# Patient Record
Sex: Male | Born: 1960 | Hispanic: Yes | Marital: Married | Smoking: Never smoker
Health system: Southern US, Community
[De-identification: ages and names within clinical notes are randomized; demographics above are authoritative.]

## PROBLEM LIST (undated history)

## (undated) DIAGNOSIS — F32A Depression, unspecified: Secondary | ICD-10-CM

## (undated) DIAGNOSIS — I10 Essential (primary) hypertension: Secondary | ICD-10-CM

## (undated) DIAGNOSIS — F329 Major depressive disorder, single episode, unspecified: Secondary | ICD-10-CM

## (undated) DIAGNOSIS — Z86010 Personal history of colonic polyps: Secondary | ICD-10-CM

---
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
## 2016-01-11 ENCOUNTER — Emergency Department
Admission: EM | Admit: 2016-01-11 | Discharge: 2016-01-11 | Disposition: A | Discharge status: Home / Self Care | Payer: Medicare Other | Attending: Emergency Medicine | Admitting: Emergency Medicine

## 2016-01-11 ENCOUNTER — Emergency Department: Payer: Medicare Other

## 2016-01-11 DIAGNOSIS — N201 Calculus of ureter: Secondary | ICD-10-CM | POA: Diagnosis not present

## 2016-01-11 DIAGNOSIS — R109 Unspecified abdominal pain: Secondary | ICD-10-CM

## 2016-01-11 LAB — URINALYSIS COMPLETE WITH MICROSCOPIC (ARMC ONLY)
BACTERIA UA: NONE SEEN
Bilirubin Urine: NEGATIVE
Glucose, UA: NEGATIVE mg/dL
KETONES UR: NEGATIVE mg/dL
Leukocytes, UA: NEGATIVE
NITRITE: NEGATIVE
PH: 5 (ref 5.0–8.0)
PROTEIN: NEGATIVE mg/dL
SPECIFIC GRAVITY, URINE: 1.018 (ref 1.005–1.030)

## 2016-01-11 MED ORDER — ONDANSETRON HCL 4 MG PO TABS: 4.0000 mg | ORAL_TABLET | Freq: Every day | ORAL | 1 refills | Status: DC | PRN

## 2016-01-11 MED ORDER — SODIUM CHLORIDE 0.9 % IV SOLN
1000.0000 mL | Freq: Once | INTRAVENOUS | Status: AC
  Administered 2016-01-11: 1000 mL via INTRAVENOUS

## 2016-01-11 MED ORDER — TAMSULOSIN HCL 0.4 MG PO CAPS: 0.4000 mg | ORAL_CAPSULE | Freq: Every day | ORAL | 0 refills | Status: DC

## 2016-01-11 MED ORDER — KETOROLAC TROMETHAMINE 30 MG/ML IJ SOLN
30.0000 mg | Freq: Once | INTRAMUSCULAR | Status: AC
  Administered 2016-01-11: 30 mg via INTRAVENOUS

## 2016-01-11 MED ORDER — ONDANSETRON HCL 4 MG/2ML IJ SOLN
4.0000 mg | Freq: Once | INTRAMUSCULAR | Status: AC
  Administered 2016-01-11: 4 mg via INTRAVENOUS

## 2016-01-11 MED ORDER — HYDROCODONE-ACETAMINOPHEN 5-325 MG PO TABS: 1.0000 | ORAL_TABLET | ORAL | 0 refills | Status: DC | PRN

## 2016-01-11 MED ORDER — ONDANSETRON HCL 4 MG/2ML IJ SOLN
INTRAMUSCULAR | Status: AC
  Administered 2016-01-11: 4 mg via INTRAVENOUS
  Filled 2016-01-11: qty 2

## 2016-01-11 MED ORDER — NAPROXEN 500 MG PO TABS: 500.0000 mg | ORAL_TABLET | Freq: Two times a day (BID) | ORAL | 2 refills | Status: AC

## 2016-01-11 MED ORDER — KETOROLAC TROMETHAMINE 30 MG/ML IJ SOLN
INTRAMUSCULAR | Status: AC
  Administered 2016-01-11: 30 mg via INTRAVENOUS
  Filled 2016-01-11: qty 1

## 2016-01-11 NOTE — ED Notes (Signed)
Pt unable to provide urine specimen during triage. Pt given specimen cup for later attempt.

## 2016-01-11 NOTE — ED Triage Notes (Signed)
Pt here for right sided flank pain that started this morning. Does report hx of kidney stones.

## 2016-01-11 NOTE — ED Provider Notes (Signed)
[facility] Emergency Department Provider Note   ____________________________________________    I have reviewed the triage vital signs and the nursing notes.   HISTORY  Chief Complaint Flank Pain    HPI Alfred Clements is a 55 y.o. male who presents with complaints of sharp right-sided flank pain which started abruptly this morning. The pain is in his right flank and radiates to his right groin. He reports a history of kidney stones and this feels similar. He reports the pain has become severe enough that he is nauseous. He denies fevers or chills. No dysuria.   No past medical history on file.  There are no active problems to display for this patient.   No past surgical history on file.  Prior to Admission medications   Medication Sig Start Date End Date Taking? Authorizing Provider  HYDROcodone-acetaminophen (NORCO/VICODIN) 5-325 MG tablet Take 1 tablet by mouth every 4 (four) hours as needed for moderate pain. 01/11/16   Lavonia Drafts, MD  naproxen (NAPROSYN) 500 MG tablet Take 1 tablet (500 mg total) by mouth 2 (two) times daily with a meal. 01/11/16   Lavonia Drafts, MD  ondansetron (ZOFRAN) 4 MG tablet Take 1 tablet (4 mg total) by mouth daily as needed for nausea or vomiting. 01/11/16   Lavonia Drafts, MD  tamsulosin (FLOMAX) 0.4 MG CAPS capsule Take 1 capsule (0.4 mg total) by mouth daily. 01/11/16   Lavonia Drafts, MD     Allergies Penicillins  No family history on file.  Social History Social History  Substance Use Topics  . Smoking status: Not on file  . Smokeless tobacco: Not on file  . Alcohol use Not on file  Denies alcohol or drug use, nonsmoker  Review of Systems  Constitutional: No fever/chills   Cardiovascular: Denies chest pain. Respiratory: Denies shortness of breath. Gastrointestinal: As above Genitourinary: Negative for dysuria. Positive hematuria  Skin: Negative for rash. Neurological: Negative for headaches  or weakness  10-point ROS otherwise negative.  ____________________________________________   PHYSICAL EXAM:  VITAL SIGNS: ED Triage Vitals  Enc Vitals Group     BP 01/11/16 1210 (!) 166/104     Pulse Rate 01/11/16 1210 60     Resp 01/11/16 1210 18     Temp 01/11/16 1210 98 F (36.7 C)     Temp Source 01/11/16 1210 Oral     SpO2 01/11/16 1210 99 %     Weight 01/11/16 1210 219 lb (99.3 kg)     Height 01/11/16 1210 5\' 9"  (1.753 m)     Head Circumference --      Peak Flow --      Pain Score 01/11/16 1211 9     Pain Loc --      Pain Edu? --      Excl. in [place]? --     Constitutional: Alert and oriented.Obviously in significant pain Eyes: Conjunctivae are normal.   Nose: No congestion/rhinnorhea. Mouth/Throat: Mucous membranes are moist.    Cardiovascular: Normal rate, regular rhythm. Grossly normal heart sounds.  Good peripheral circulation. Respiratory: Normal respiratory effort.  No retractions. Lungs CTAB. Gastrointestinal: Soft and nontender. No distention.  No CVA tenderness. Genitourinary: deferred Musculoskeletal: No lower extremity tenderness nor edema.  Warm and well perfused Neurologic:  Normal speech and language. No gross focal neurologic deficits are appreciated.  Skin:  Skin is warm, dry and intact. No rash noted. Psychiatric: Mood and affect are normal. Speech and behavior are normal.  ____________________________________________   LABS (all  labs ordered are listed, but only abnormal results are displayed)  Labs Reviewed  URINALYSIS COMPLETEWITH MICROSCOPIC ([place]) - Abnormal; Notable for the following:       Result Value   Color, Urine YELLOW (*)    APPearance CLEAR (*)    Hgb urine dipstick 2+ (*)    Squamous Epithelial / LPF 0-5 (*)    All other components within normal limits   ____________________________________________  EKG  None ____________________________________________  RADIOLOGY  CT demonstrates 3 mm UVJ  stone ____________________________________________   PROCEDURES  Procedure(s) performed: No    Critical Care performed: No ____________________________________________   INITIAL IMPRESSION / ASSESSMENT AND PLAN / ED COURSE  Pertinent labs & imaging results that were available during my care of the patient were reviewed by me and considered in my medical decision making (see chart for details).  Patient presents with complaints of right-sided flank pain, most consistent with ureterolithiasis. We'll give IV Toradol, IV fluids, obtain CT renal stone study and reevaluate  Clinical Course  Patient and complete resolution of pain after IV Toradol. His CT scan does demonstrate 3 mm stone at the UVJ, this will likely pass. We will provide analgesics and outpatient follow up as needed. Return precautions discussed. ____________________________________________   FINAL CLINICAL IMPRESSION(S) / ED DIAGNOSES  Final diagnoses:  Right flank pain  Ureterolithiasis      NEW MEDICATIONS STARTED DURING THIS VISIT:  New Prescriptions   HYDROCODONE-ACETAMINOPHEN (NORCO/VICODIN) 5-325 MG TABLET    Take 1 tablet by mouth every 4 (four) hours as needed for moderate pain.   NAPROXEN (NAPROSYN) 500 MG TABLET    Take 1 tablet (500 mg total) by mouth 2 (two) times daily with a meal.   ONDANSETRON (ZOFRAN) 4 MG TABLET    Take 1 tablet (4 mg total) by mouth daily as needed for nausea or vomiting.   TAMSULOSIN (FLOMAX) 0.4 MG CAPS CAPSULE    Take 1 capsule (0.4 mg total) by mouth daily.     Note:  This document was prepared using Dragon voice recognition software and may include unintentional dictation errors.    Lavonia Drafts, MD 01/11/16 [id number]

## 2019-07-28 ENCOUNTER — Other Ambulatory Visit: Payer: Self-pay

## 2019-07-28 ENCOUNTER — Other Ambulatory Visit
Admission: RE | Admit: 2019-07-28 | Discharge: 2019-07-28 | Disposition: A | Discharge status: Home / Self Care | Payer: Medicare Other | Source: Ambulatory Visit | Attending: General Surgery | Admitting: General Surgery

## 2019-07-28 DIAGNOSIS — Z01812 Encounter for preprocedural laboratory examination: Secondary | ICD-10-CM | POA: Diagnosis present

## 2019-07-28 DIAGNOSIS — Z20822 Contact with and (suspected) exposure to covid-19: Secondary | ICD-10-CM | POA: Insufficient documentation

## 2019-07-29 ENCOUNTER — Encounter: Payer: Self-pay | Admitting: General Surgery

## 2019-07-29 LAB — SARS CORONAVIRUS 2 (TAT 6-24 HRS): SARS Coronavirus 2: NEGATIVE

## 2019-07-30 ENCOUNTER — Ambulatory Visit: Payer: Medicare Other | Admitting: Certified Registered"

## 2019-07-30 ENCOUNTER — Encounter
Admission: RE | Disposition: A | Discharge status: Home / Self Care | Payer: Self-pay | Source: Ambulatory Visit | Attending: General Surgery

## 2019-07-30 ENCOUNTER — Other Ambulatory Visit: Payer: Self-pay

## 2019-07-30 ENCOUNTER — Ambulatory Visit
Admission: RE | Admit: 2019-07-30 | Discharge: 2019-07-30 | Disposition: A | Discharge status: Home / Self Care | Payer: Medicare Other | Source: Ambulatory Visit | Attending: General Surgery | Admitting: General Surgery

## 2019-07-30 ENCOUNTER — Encounter: Payer: Self-pay | Admitting: General Surgery

## 2019-07-30 DIAGNOSIS — I10 Essential (primary) hypertension: Secondary | ICD-10-CM | POA: Insufficient documentation

## 2019-07-30 DIAGNOSIS — Z79899 Other long term (current) drug therapy: Secondary | ICD-10-CM | POA: Diagnosis not present

## 2019-07-30 DIAGNOSIS — Z1211 Encounter for screening for malignant neoplasm of colon: Secondary | ICD-10-CM | POA: Insufficient documentation

## 2019-07-30 DIAGNOSIS — D123 Benign neoplasm of transverse colon: Secondary | ICD-10-CM | POA: Insufficient documentation

## 2019-07-30 DIAGNOSIS — Z880 Allergy status to penicillin: Secondary | ICD-10-CM | POA: Insufficient documentation

## 2019-07-30 DIAGNOSIS — Z86010 Personal history of colonic polyps: Secondary | ICD-10-CM | POA: Diagnosis not present

## 2019-07-30 DIAGNOSIS — K5730 Diverticulosis of large intestine without perforation or abscess without bleeding: Secondary | ICD-10-CM | POA: Insufficient documentation

## 2019-07-30 DIAGNOSIS — Z791 Long term (current) use of non-steroidal anti-inflammatories (NSAID): Secondary | ICD-10-CM | POA: Diagnosis not present

## 2019-07-30 DIAGNOSIS — F329 Major depressive disorder, single episode, unspecified: Secondary | ICD-10-CM | POA: Diagnosis not present

## 2019-07-30 HISTORY — DX: Essential (primary) hypertension: I10

## 2019-07-30 HISTORY — PX: COLONOSCOPY WITH PROPOFOL: SHX5780

## 2019-07-30 HISTORY — DX: Depression, unspecified: F32.A

## 2019-07-30 HISTORY — DX: Personal history of colonic polyps: Z86.010

## 2019-07-30 SURGERY — COLONOSCOPY WITH PROPOFOL
Anesthesia: General

## 2019-07-30 MED ORDER — PROPOFOL 500 MG/50ML IV EMUL
INTRAVENOUS | Status: AC
  Filled 2019-07-30: qty 50

## 2019-07-30 MED ORDER — PROPOFOL 500 MG/50ML IV EMUL
INTRAVENOUS | Status: DC | PRN
  Administered 2019-07-30: 150 ug/kg/min via INTRAVENOUS

## 2019-07-30 MED ORDER — PROPOFOL 10 MG/ML IV BOLUS
INTRAVENOUS | Status: DC | PRN
  Administered 2019-07-30: 50 mg via INTRAVENOUS

## 2019-07-30 MED ORDER — ONDANSETRON HCL 4 MG/2ML IJ SOLN
INTRAMUSCULAR | Status: AC
  Filled 2019-07-30: qty 2

## 2019-07-30 MED ORDER — SODIUM CHLORIDE 0.9 % IV SOLN: INTRAVENOUS | Status: DC

## 2019-07-30 MED ORDER — ONDANSETRON HCL 4 MG/2ML IJ SOLN
4.0000 mg | Freq: Once | INTRAMUSCULAR | Status: AC
  Administered 2019-07-30: 4 mg via INTRAVENOUS

## 2019-07-30 NOTE — Anesthesia Preprocedure Evaluation (Signed)
Anesthesia Evaluation  Patient identified by MRN, date of birth, ID band Patient awake    Reviewed: Allergy & Precautions, H&P , NPO status , Patient's Chart, lab work & pertinent test results  Airway Mallampati: II  TM Distance: >3 FB Neck ROM: full    Dental  (+) Teeth Intact   Pulmonary neg pulmonary ROS, neg sleep apnea, neg COPD, Not current smoker,    Pulmonary exam normal        Cardiovascular hypertension, (-) angina(-) Past MI Normal cardiovascular exam(-) dysrhythmias      Neuro/Psych negative neurological ROS  negative psych ROS   GI/Hepatic negative GI ROS, Neg liver ROS,   Endo/Other  negative endocrine ROS  Renal/GU negative Renal ROS  negative genitourinary   Musculoskeletal   Abdominal   Peds  Hematology negative hematology ROS (+)   Anesthesia Other Findings Past Medical History: No date: Depression No date: Hx of colonic polyps No date: Hypertension  History reviewed. No pertinent surgical history.  BMI    Body Mass Index: 30.42 kg/m      Reproductive/Obstetrics negative OB ROS                             Anesthesia Physical Anesthesia Plan  ASA: II  Anesthesia Plan: General   Post-op Pain Management:    Induction:   PONV Risk Score and Plan: Propofol infusion and TIVA  Airway Management Planned: Natural Airway and Nasal Cannula  Additional Equipment:   Intra-op Plan:   Post-operative Plan:   Informed Consent: I have reviewed the patients History and Physical, chart, labs and discussed the procedure including the risks, benefits and alternatives for the proposed anesthesia with the patient or authorized representative who has indicated his/her understanding and acceptance.     Dental Advisory Given  Plan Discussed with: Anesthesiologist, CRNA and Surgeon  Anesthesia Plan Comments:         Anesthesia Quick Evaluation

## 2019-07-30 NOTE — OR Nursing (Signed)
Pt vomiting up mucus. On investigation he was nauseated. zofran adm.at 1122am.

## 2019-07-30 NOTE — Transfer of Care (Signed)
Immediate Anesthesia Transfer of Care Note  Patient: Alfred Clements  Procedure(s) Performed: COLONOSCOPY WITH PROPOFOL (N/A )  Patient Location: PACU  Anesthesia Type:General  Level of Consciousness: awake, alert  and oriented  Airway & Oxygen Therapy: Patient Spontanous Breathing and Patient connected to nasal cannula oxygen  Post-op Assessment: Report given to RN and Post -op Vital signs reviewed and stable  Post vital signs: Reviewed and stable  Last Vitals:  Vitals Value Taken Time  BP    Temp    Pulse    Resp    SpO2      Last Pain:  Vitals:   07/30/19 0944  TempSrc: Temporal         Complications: No apparent anesthesia complications

## 2019-07-30 NOTE — H&P (Signed)
Alfred Clements [id number] [birth year]     HPI:  59 year old Spanish speaking male reports colonoscopy in [place] about 7years ago. Three polyps identified. Original recommendation was for a f/u exam in five years.  Tolerated prep without difficulty.     Medications Prior to Admission  Medication Sig Dispense Refill Last Dose  . FLUoxetine (PROZAC) 20 MG tablet Take 20 mg by mouth daily.     Marland Kitchen gabapentin (NEURONTIN) 300 MG capsule Take 300 mg by mouth at bedtime.     . hydrochlorothiazide (HYDRODIURIL) 12.5 MG tablet Take 12.5 mg by mouth daily.     . Multiple Vitamin (MULTIVITAMIN) tablet Take 1 tablet by mouth daily.     Marland Kitchen HYDROcodone-acetaminophen (NORCO/VICODIN) 5-325 MG tablet Take 1 tablet by mouth every 4 (four) hours as needed for moderate pain. 20 tablet 0   . naproxen (NAPROSYN) 500 MG tablet Take 1 tablet (500 mg total) by mouth 2 (two) times daily with a meal. 20 tablet 2   . ondansetron (ZOFRAN) 4 MG tablet Take 1 tablet (4 mg total) by mouth daily as needed for nausea or vomiting. 20 tablet 1   . tamsulosin (FLOMAX) 0.4 MG CAPS capsule Take 1 capsule (0.4 mg total) by mouth daily. 7 capsule 0    Allergies  Allergen Reactions  . Penicillins Hives   Past Medical History:  Diagnosis Date  . Depression   . Hx of colonic polyps   . Hypertension    History reviewed. No pertinent surgical history. Social History   Socioeconomic History  . Marital status: Married    Spouse name: Not on file  . Number of children: Not on file  . Years of education: Not on file  . Highest education level: Not on file  Occupational History  . Not on file  Tobacco Use  . Smoking status: Never Smoker  . Smokeless tobacco: Never Used  Substance and Sexual Activity  . Alcohol use: Never  . Drug use: Never  . Sexual activity: Not on file  Other Topics Concern  . Not on file  Social History Narrative  . Not on file   Social Determinants of Health   Financial Resource Strain:   .  Difficulty of Paying Living Expenses:   Food Insecurity:   . Worried About Charity fundraiser in the Last Year:   . Arboriculturist in the Last Year:   Transportation Needs:   . Film/video editor (Medical):   Marland Kitchen Lack of Transportation (Non-Medical):   Physical Activity:   . Days of Exercise per Week:   . Minutes of Exercise per Session:   Stress:   . Feeling of Stress :   Social Connections:   . Frequency of Communication with Friends and Family:   . Frequency of Social Gatherings with Friends and Family:   . Attends Religious Services:   . Active Member of Clubs or Organizations:   . Attends Archivist Meetings:   Marland Kitchen Marital Status:   Intimate Partner Violence:   . Fear of Current or Ex-Partner:   . Emotionally Abused:   Marland Kitchen Physically Abused:   . Sexually Abused:    Social History   Social History Narrative  . Not on file     ROS: Negative.     PE: HEENT: Negative. Lungs: Clear. Cardio: RR.   Assessment/Plan:  Proceed with planned endoscopy.Alfred Clements Alfred Clements 07/30/2019

## 2019-07-30 NOTE — Op Note (Signed)
[facility] Gastroenterology Patient Name: Alfred Clements Procedure Date: 07/30/2019 10:11 AM MRN: [id number] Account #: [id number] Date of Birth: 1960/10/17 Admit Type: Outpatient Age: 59 Room: [facility] ENDO ROOM 1 Gender: Male Note Status: Finalized Procedure:             Colonoscopy Indications:           High risk colon cancer surveillance: Personal history                         of colonic polyps Providers:             Robert Bellow, MD Referring MD:          Andres Labrum, MD (Referring MD) Medicines:             Monitored Anesthesia Care Complications:         No immediate complications. Procedure:             Pre-Anesthesia Assessment:                        - Prior to the procedure, a History and Physical was                         performed, and patient medications, allergies and                         sensitivities were reviewed. The patient's tolerance                         of previous anesthesia was reviewed.                        - The risks and benefits of the procedure and the                         sedation options and risks were discussed with the                         patient. All questions were answered and informed                         consent was obtained.                        After obtaining informed consent, the colonoscope was                         passed under direct vision. Throughout the procedure,                         the patient's blood pressure, pulse, and oxygen                         saturations were monitored continuously. The                         Colonoscope was introduced through the anus and                         advanced to the the cecum,  identified by appendiceal                         orifice and ileocecal valve. The colonoscopy was                         performed without difficulty. The patient tolerated                         the procedure well. The quality of the bowel       preparation was excellent. Findings:      A 5 mm polyp was found in the proximal transverse colon. The polyp was       sessile. Biopsies were taken with a cold forceps for histology.      A few small-mouthed diverticula were found in the sigmoid colon.      The retroflexed view of the distal rectum and anal verge was normal and       showed no anal or rectal abnormalities. Impression:            - One 5 mm polyp in the proximal transverse colon.                         Biopsied.                        - Diverticulosis in the sigmoid colon.                        - The distal rectum and anal verge are normal on                         retroflexion view. Recommendation:        - Telephone endoscopist for pathology results in 1                         week. Procedure Code(s):     --- Professional ---                        [phone], Colonoscopy, flexible; with biopsy, single or                         multiple Diagnosis Code(s):     --- Professional ---                        Z86.010, Personal history of colonic polyps                        K63.5, Polyp of colon                        K57.30, Diverticulosis of large intestine without                         perforation or abscess without bleeding CPT copyright 2019 American Medical Association. All rights reserved. The codes documented in this report are preliminary and upon coder review may  be revised to meet current compliance requirements. Robert Bellow, MD 07/30/2019 10:38:49 AM This report has been signed electronically. Number of Addenda: 0 Note Initiated On: 07/30/2019  10:11 AM Scope Withdrawal Time: 0 hours 8 minutes 50 seconds  Total Procedure Duration: 0 hours 17 minutes 44 seconds  Estimated Blood Loss:  Estimated blood loss: none.      [facility]

## 2019-07-31 ENCOUNTER — Encounter: Payer: Self-pay | Admitting: *Deleted

## 2019-07-31 LAB — SURGICAL PATHOLOGY

## 2019-07-31 NOTE — Anesthesia Postprocedure Evaluation (Signed)
Anesthesia Post Note  Patient: Alfred Clements  Procedure(s) Performed: COLONOSCOPY WITH PROPOFOL (N/A )  Patient location during evaluation: PACU Anesthesia Type: General Level of consciousness: awake and alert Pain management: pain level controlled Vital Signs Assessment: post-procedure vital signs reviewed and stable Respiratory status: spontaneous breathing, nonlabored ventilation and respiratory function stable Cardiovascular status: blood pressure returned to baseline and stable Postop Assessment: no apparent nausea or vomiting Anesthetic complications: no     Last Vitals:  Vitals:   07/30/19 1110 07/30/19 1120  BP: 113/80 118/78  Pulse: (!) 46 (!) 48  Resp: 19 (!) 22  Temp:    SpO2: 99% 99%    Last Pain:  Vitals:   07/30/19 1040  TempSrc: Temporal                 Tera Mater

## 2020-02-04 ENCOUNTER — Other Ambulatory Visit: Payer: Self-pay

## 2020-02-04 ENCOUNTER — Inpatient Hospital Stay
Admission: EM | Admit: 2020-02-04 | Discharge: 2020-03-13 | DRG: 208 | Disposition: E | Payer: Medicare Other | Attending: Internal Medicine | Admitting: Internal Medicine

## 2020-02-04 ENCOUNTER — Emergency Department: Payer: Medicare Other

## 2020-02-04 DIAGNOSIS — K219 Gastro-esophageal reflux disease without esophagitis: Secondary | ICD-10-CM | POA: Diagnosis present

## 2020-02-04 DIAGNOSIS — I10 Essential (primary) hypertension: Secondary | ICD-10-CM | POA: Diagnosis present

## 2020-02-04 DIAGNOSIS — J9600 Acute respiratory failure, unspecified whether with hypoxia or hypercapnia: Secondary | ICD-10-CM | POA: Diagnosis not present

## 2020-02-04 DIAGNOSIS — Z880 Allergy status to penicillin: Secondary | ICD-10-CM | POA: Diagnosis not present

## 2020-02-04 DIAGNOSIS — Y92239 Unspecified place in hospital as the place of occurrence of the external cause: Secondary | ICD-10-CM | POA: Diagnosis not present

## 2020-02-04 DIAGNOSIS — R109 Unspecified abdominal pain: Secondary | ICD-10-CM

## 2020-02-04 DIAGNOSIS — F32A Depression, unspecified: Secondary | ICD-10-CM | POA: Diagnosis present

## 2020-02-04 DIAGNOSIS — E878 Other disorders of electrolyte and fluid balance, not elsewhere classified: Secondary | ICD-10-CM | POA: Diagnosis present

## 2020-02-04 DIAGNOSIS — K921 Melena: Secondary | ICD-10-CM | POA: Diagnosis present

## 2020-02-04 DIAGNOSIS — A4189 Other specified sepsis: Secondary | ICD-10-CM | POA: Diagnosis not present

## 2020-02-04 DIAGNOSIS — I469 Cardiac arrest, cause unspecified: Secondary | ICD-10-CM | POA: Diagnosis not present

## 2020-02-04 DIAGNOSIS — Z833 Family history of diabetes mellitus: Secondary | ICD-10-CM | POA: Diagnosis not present

## 2020-02-04 DIAGNOSIS — R0902 Hypoxemia: Secondary | ICD-10-CM

## 2020-02-04 DIAGNOSIS — R748 Abnormal levels of other serum enzymes: Secondary | ICD-10-CM | POA: Diagnosis not present

## 2020-02-04 DIAGNOSIS — F419 Anxiety disorder, unspecified: Secondary | ICD-10-CM | POA: Diagnosis present

## 2020-02-04 DIAGNOSIS — I4891 Unspecified atrial fibrillation: Secondary | ICD-10-CM | POA: Diagnosis not present

## 2020-02-04 DIAGNOSIS — E876 Hypokalemia: Secondary | ICD-10-CM | POA: Diagnosis present

## 2020-02-04 DIAGNOSIS — R001 Bradycardia, unspecified: Secondary | ICD-10-CM | POA: Diagnosis not present

## 2020-02-04 DIAGNOSIS — J9601 Acute respiratory failure with hypoxia: Secondary | ICD-10-CM

## 2020-02-04 DIAGNOSIS — R6521 Severe sepsis with septic shock: Secondary | ICD-10-CM | POA: Diagnosis not present

## 2020-02-04 DIAGNOSIS — E860 Dehydration: Secondary | ICD-10-CM | POA: Diagnosis present

## 2020-02-04 DIAGNOSIS — N179 Acute kidney failure, unspecified: Secondary | ICD-10-CM | POA: Diagnosis not present

## 2020-02-04 DIAGNOSIS — Z8249 Family history of ischemic heart disease and other diseases of the circulatory system: Secondary | ICD-10-CM | POA: Diagnosis not present

## 2020-02-04 DIAGNOSIS — T380X5A Adverse effect of glucocorticoids and synthetic analogues, initial encounter: Secondary | ICD-10-CM | POA: Diagnosis not present

## 2020-02-04 DIAGNOSIS — Z86010 Personal history of colonic polyps: Secondary | ICD-10-CM | POA: Diagnosis not present

## 2020-02-04 DIAGNOSIS — J189 Pneumonia, unspecified organism: Secondary | ICD-10-CM | POA: Diagnosis not present

## 2020-02-04 DIAGNOSIS — Z01818 Encounter for other preprocedural examination: Secondary | ICD-10-CM

## 2020-02-04 DIAGNOSIS — E872 Acidosis: Secondary | ICD-10-CM | POA: Diagnosis not present

## 2020-02-04 DIAGNOSIS — Z79899 Other long term (current) drug therapy: Secondary | ICD-10-CM | POA: Diagnosis not present

## 2020-02-04 DIAGNOSIS — R778 Other specified abnormalities of plasma proteins: Secondary | ICD-10-CM | POA: Diagnosis not present

## 2020-02-04 DIAGNOSIS — J1282 Pneumonia due to coronavirus disease 2019: Secondary | ICD-10-CM | POA: Diagnosis present

## 2020-02-04 DIAGNOSIS — Y95 Nosocomial condition: Secondary | ICD-10-CM | POA: Diagnosis not present

## 2020-02-04 DIAGNOSIS — J80 Acute respiratory distress syndrome: Secondary | ICD-10-CM | POA: Diagnosis present

## 2020-02-04 DIAGNOSIS — R1110 Vomiting, unspecified: Secondary | ICD-10-CM

## 2020-02-04 DIAGNOSIS — R000 Tachycardia, unspecified: Secondary | ICD-10-CM | POA: Diagnosis not present

## 2020-02-04 DIAGNOSIS — R197 Diarrhea, unspecified: Secondary | ICD-10-CM | POA: Diagnosis present

## 2020-02-04 DIAGNOSIS — Z4659 Encounter for fitting and adjustment of other gastrointestinal appliance and device: Secondary | ICD-10-CM

## 2020-02-04 DIAGNOSIS — E1165 Type 2 diabetes mellitus with hyperglycemia: Secondary | ICD-10-CM | POA: Diagnosis not present

## 2020-02-04 DIAGNOSIS — A419 Sepsis, unspecified organism: Secondary | ICD-10-CM | POA: Diagnosis not present

## 2020-02-04 DIAGNOSIS — E871 Hypo-osmolality and hyponatremia: Secondary | ICD-10-CM | POA: Diagnosis present

## 2020-02-04 DIAGNOSIS — U071 COVID-19: Principal | ICD-10-CM | POA: Diagnosis present

## 2020-02-04 DIAGNOSIS — Z0189 Encounter for other specified special examinations: Secondary | ICD-10-CM

## 2020-02-04 DIAGNOSIS — R0602 Shortness of breath: Secondary | ICD-10-CM

## 2020-02-04 LAB — COMPREHENSIVE METABOLIC PANEL
ALT: 20 U/L (ref 0–44)
AST: 56 U/L — ABNORMAL HIGH (ref 15–41)
Albumin: 3.7 g/dL (ref 3.5–5.0)
Alkaline Phosphatase: 50 U/L (ref 38–126)
Anion gap: 13 (ref 5–15)
BUN: 20 mg/dL (ref 8–23)
CO2: 26 mmol/L (ref 22–32)
Calcium: 8.6 mg/dL — ABNORMAL LOW (ref 8.9–10.3)
Chloride: 90 mmol/L — ABNORMAL LOW (ref 98–111)
Creatinine, Ser: 1.34 mg/dL — ABNORMAL HIGH (ref 0.61–1.24)
GFR, Estimated: >60 mL/min (ref >60)
Glucose, Bld: 123 mg/dL — ABNORMAL HIGH (ref 70–99)
Potassium: 3.4 mmol/L — ABNORMAL LOW (ref 3.5–5.1)
Sodium: 129 mmol/L — ABNORMAL LOW (ref 135–145)
Total Bilirubin: 0.6 mg/dL (ref 0.3–1.2)
Total Protein: 8.5 g/dL — ABNORMAL HIGH (ref 6.5–8.1)

## 2020-02-04 LAB — CBC WITH DIFFERENTIAL/PLATELET
Abs Immature Granulocytes: 0.03 10*3/uL (ref 0.00–0.07)
Basophils Absolute: 0 10*3/uL (ref 0.0–0.1)
Basophils Relative: 0 %
Eosinophils Absolute: 0 10*3/uL (ref 0.0–0.5)
Eosinophils Relative: 0 %
HCT: 43.8 % (ref 39.0–52.0)
Hemoglobin: 15.1 g/dL (ref 13.0–17.0)
Immature Granulocytes: 1 %
Lymphocytes Relative: 13 %
Lymphs Abs: 0.8 10*3/uL (ref 0.7–4.0)
MCH: 31.9 pg (ref 26.0–34.0)
MCHC: 34.5 g/dL (ref 30.0–36.0)
MCV: 92.6 fL (ref 80.0–100.0)
Monocytes Absolute: 0.3 10*3/uL (ref 0.1–1.0)
Monocytes Relative: 5 %
Neutro Abs: 5.1 10*3/uL (ref 1.7–7.7)
Neutrophils Relative %: 81 %
Platelets: 188 10*3/uL (ref 150–400)
RBC: 4.73 MIL/uL (ref 4.22–5.81)
RDW: 12 % (ref 11.5–15.5)
WBC: 6.2 10*3/uL (ref 4.0–10.5)
nRBC: 0 % (ref 0.0–0.2)

## 2020-02-04 LAB — RESP PANEL BY RT-PCR (FLU A&B, COVID) ARPGX2
Influenza A by PCR: NEGATIVE
Influenza B by PCR: NEGATIVE
SARS Coronavirus 2 by RT PCR: POSITIVE — AB

## 2020-02-04 LAB — BRAIN NATRIURETIC PEPTIDE: B Natriuretic Peptide: 40.5 pg/mL (ref 0.0–100.0)

## 2020-02-04 LAB — TROPONIN I (HIGH SENSITIVITY)
Troponin I (High Sensitivity): 26 ng/L — ABNORMAL HIGH (ref <18)
Troponin I (High Sensitivity): 32 ng/L — ABNORMAL HIGH (ref <18)

## 2020-02-04 MED ORDER — ADULT MULTIVITAMIN W/MINERALS CH
1.0000 | ORAL_TABLET | Freq: Every day | ORAL | Status: DC
  Administered 2020-02-05 – 2020-02-21 (×15): 1 via ORAL
  Filled 2020-02-04 (×17): qty 1

## 2020-02-04 MED ORDER — HYDROXYZINE PAMOATE 25 MG PO CAPS: 25.0000 mg | ORAL_CAPSULE | Freq: Two times a day (BID) | ORAL | Status: DC

## 2020-02-04 MED ORDER — HYDROXYZINE HCL 25 MG PO TABS
25.0000 mg | ORAL_TABLET | Freq: Two times a day (BID) | ORAL | Status: DC
  Administered 2020-02-05 – 2020-02-09 (×10): 25 mg via ORAL
  Filled 2020-02-04 (×10): qty 1

## 2020-02-04 MED ORDER — FLUOXETINE HCL 20 MG PO CAPS
40.0000 mg | ORAL_CAPSULE | Freq: Every day | ORAL | Status: DC
  Administered 2020-02-05 – 2020-02-20 (×16): 40 mg via ORAL
  Filled 2020-02-04 (×19): qty 2

## 2020-02-04 MED ORDER — GABAPENTIN 300 MG PO CAPS
300.0000 mg | ORAL_CAPSULE | Freq: Every day | ORAL | Status: DC
  Administered 2020-02-05 – 2020-02-08 (×5): 300 mg via ORAL
  Filled 2020-02-04 (×5): qty 1

## 2020-02-04 MED ORDER — IBUPROFEN 600 MG PO TABS
600.0000 mg | ORAL_TABLET | Freq: Once | ORAL | Status: AC
  Administered 2020-02-04: 600 mg via ORAL
  Filled 2020-02-04: qty 1

## 2020-02-04 NOTE — ED Triage Notes (Addendum)
Triage completed using spanish interpreter.  Pt here via [facility] from home with complaints of covid like symptoms. Pt here with shortness of breath, NVD, fever, and wheezing. Diminished left lower lobe sounds.   Symptoms started on Monday. Pt has covid positive family member who is hospitalized. Pt's wife and daughter also present with similar symptoms.   EMS VS- Oxygen 86% RA, placed on 3L with sats on 96%, RR 24, bp 122/90, temp 103.1 orally, HR 89.  Pt reports last taking tylenol last night but it makes his symptoms worse.

## 2020-02-04 NOTE — H&P (Signed)
[place]   PATIENT NAME: Alfred Clements    MR#:  [id number]  DATE OF BIRTH:  1960-09-17  DATE OF ADMISSION:  02/09/2020  PRIMARY CARE PHYSICIAN: Baxter Hire, MD   REQUESTING/REFERRING PHYSICIAN: Conni Slipper, MD CHIEF COMPLAINT:   Chief Complaint  Patient presents with  . Shortness of Breath    HISTORY OF PRESENT ILLNESS:  Alfred Clements  is a 59 y.o. Hispanic male with a known history of depression and hypertension, who presented to the emergency room with acute onset of dyspnea and fever as well as dry cough and wheezing since Monday.  He admits to fever and chills as well as loss of taste and smell.  He has been having fatigue and tiredness.  He admitted to diarrhea with bright red bleeding per rectum or melena.  No nausea or vomiting.  He has been exposed to his son who was diagnosed with COVID-19 a couple days ago.  2 daughters also have similar symptoms.  The patient was noted to have hypoxemia with pulse oximetry has been ranging 88 to 89% on room air.  The patient has not been vaccinated for COVID-19.  Upon presentation to the emergency room, temperature was 103.4 with respiratory to 24.  Labs revealed mild hypokalemia and hyponatremia and hypochloremia, creatinine of 1.38 and AST 56.  BNP was 40.5.  High-sensitivity troponin I was 26 and later 3 2.  CBC was within normal.  COVID-19 PCR came back positive and influenza antigens were negative.  Two-view chest x-ray showed findings concerning for atypical pneumonia with differential diagnosis including pulmonary edema.EKG showed sinus rhythm with rate of 95 with PACs with aberrant conductions, left extubation, moderate voltage criteria for LVH and T wave inversion laterally.  The patient was given 600 g of p.o. Advil.  He will be admitted to a medical monitored bed for further evaluation and management.  PAST MEDICAL HISTORY:   Past Medical History:  Diagnosis Date  . Depression   . Hx of colonic polyps   .  Hypertension     PAST SURGICAL HISTORY:   Past Surgical History:  Procedure Laterality Date  . COLONOSCOPY WITH PROPOFOL N/A 07/30/2019   Procedure: COLONOSCOPY WITH PROPOFOL;  Surgeon: Robert Bellow, MD;  Location: [facility] ENDOSCOPY;  Service: Endoscopy;  Laterality: N/A;    SOCIAL HISTORY:   Social History   Tobacco Use  . Smoking status: Never Smoker  . Smokeless tobacco: Never Used  Substance Use Topics  . Alcohol use: Never    FAMILY HISTORY:  Positive for diabetes mellitus and hypertension. DRUG ALLERGIES:   Allergies  Allergen Reactions  . Penicillins Hives    REVIEW OF SYSTEMS:   ROS As per history of present illness. All pertinent systems were reviewed above. Constitutional, HEENT, cardiovascular, respiratory, GI, GU, musculoskeletal, neuro, psychiatric, endocrine, integumentary and hematologic systems were reviewed and are otherwise negative/unremarkable except for positive findings mentioned above in the HPI.   MEDICATIONS AT HOME:   Prior to Admission medications   Medication Sig Start Date End Date Taking? Authorizing Provider  FLUoxetine (PROZAC) 40 MG capsule Take 40 mg by mouth at bedtime. 12/26/19  Yes [provider]  gabapentin (NEURONTIN) 300 MG capsule Take 300 mg by mouth at bedtime.   Yes [provider]  hydrOXYzine (VISTARIL) 25 MG capsule Take 25 mg by mouth 2 (two) times daily. 12/26/19  Yes [provider]  Multiple Vitamin (MULTIVITAMIN) tablet Take 1 tablet by mouth daily.   Yes  [provider]  naproxen (NAPROSYN) 500 MG tablet Take 1 tablet (500 mg total) by mouth 2 (two) times daily with a meal. 01/11/16  Yes Lavonia Drafts, MD      VITAL SIGNS:  Blood pressure 105/73, pulse (!) 57, temperature 98.6 F (37 C), temperature source Oral, resp. rate (!) 25, height 5' 9.5" (1.765 m), weight 98.4 kg, SpO2 93 %.  PHYSICAL EXAMINATION:  Physical Exam  GENERAL:  59 y.o.-year-old Hispanic male patient  lying in the bed with mild respiratory distress with conversational dyspnea. EYES: Pupils equal, round, reactive to light and accommodation. No scleral icterus. Extraocular muscles intact.  HEENT: Head atraumatic, normocephalic. Oropharynx and nasopharynx clear.  NECK:  Supple, no jugular venous distention. No thyroid enlargement, no tenderness.  LUNGS: Diminished bibasal breath sounds with bibasilar crackles. CARDIOVASCULAR: Regular rate and rhythm, S1, S2 normal. No murmurs, rubs, or gallops.  ABDOMEN: Soft, nondistended, nontender. Bowel sounds present. No organomegaly or mass.  EXTREMITIES: No pedal edema, cyanosis, or clubbing.  NEUROLOGIC: Cranial nerves II through XII are intact. Muscle strength 5/5 in all extremities. Sensation intact. Gait not checked.  PSYCHIATRIC: The patient is alert and oriented x 3.  Normal affect and good eye contact. SKIN: No obvious rash, lesion, or ulcer.   LABORATORY PANEL:   CBC Recent Labs  Lab 02/10/2020 1821  WBC 6.2  HGB 15.1  HCT 43.8  PLT 188   ------------------------------------------------------------------------------------------------------------------  Chemistries  Recent Labs  Lab 01/23/2020 1821  NA 129*  K 3.4*  CL 90*  CO2 26  GLUCOSE 123*  BUN 20  CREATININE 1.34*  CALCIUM 8.6*  AST 56*  ALT 20  ALKPHOS 50  BILITOT 0.6   ------------------------------------------------------------------------------------------------------------------  Cardiac Enzymes No results for input(s): TROPONINI in the last 168 hours. ------------------------------------------------------------------------------------------------------------------  RADIOLOGY:  DG Chest 2 View  Result Date: 01/19/2020 CLINICAL DATA:  59 year old male with shortness of breath. EXAM: CHEST - 2 VIEW COMPARISON:  None FINDINGS: Bilateral streaky pulmonary densities may represent edema but concerning for atypical infection. Clinical correlation is recommended. No  focal consolidation, pleural effusion, pneumothorax. The cardiac silhouette is within limits. No acute osseous pathology. IMPRESSION: Findings may represent edema but concerning for atypical infection. Clinical correlation is recommended. Electronically Signed   By: Anner Crete M.D.   On: 01/18/2020 19:26      IMPRESSION AND PLAN:  1.  Acute hypoxemic respiratory failure secondary to COVID-19. -The patient will be admitted to a medically monitored isolation bed. -O2 protocol will be followed to keep O2 saturation above 93.   2.  Multifocal pneumonia secondary to COVID-19. -The patient will be admitted to an isolation monitored bed with droplet and contact precautions. -Given multifocal pneumonia we will empirically place the patient on IV Rocephin and Zithromax for possible bacterial superinfection only with elevated Procalcitonin. -The patient will be placed on scheduled Mucinex and as needed Tussionex. -We will avoid nebulization as much as we can, give bronchodilator MDI if needed, and with deterioration of oxygenation try to avoid BiPAP/CPAP if possible.    -Will obtain sputum Gram stain culture and sensitivity and follow blood cultures. -O2 protocol will be followed. -We will follow CRP, ferritin, LDH and D-dimer. -Will follow manual differential for ANC/ALC ratio as well as follow troponin I and daily CBC with manual differential and CMP. - Will place the patient on IV Remdesivir and IV steroid therapy with IV Solu-Medrol with elevated inflammatory markers. -The patient will be placed on vitamin D3, vitamin C, zinc sulfate, p.o.  Pepcid and aspirin. -I discussed Baricitinib and the patient agreed to proceed with it.  3.  Hypokalemia. -Potassium will be replaced and magnesium level will be checked.  4.  Hyponatremia and hypochloremia with mild acute kidney injury likely prerenal due to dehydration. -The patient will be hydrated with IV normal saline and will follow BMP.  5.   Essential hypertension. -We will place him on as needed IV labetalol.  6.  Depression. -Continue Prozac.  7.  DVT prophylaxis. -Subcutaneous Lovenox.  All the records are reviewed and case discussed with ED provider. The plan of care was discussed in details with the patient (and family). I answered all questions. The patient agreed to proceed with the above mentioned plan. Further management will depend upon hospital course.   CODE STATUS: Full code  Status is: Inpatient  Remains inpatient appropriate because:Ongoing diagnostic testing needed not appropriate for outpatient work up, Unsafe d/c plan, IV treatments appropriate due to intensity of illness or inability to take PO and Inpatient level of care appropriate due to severity of illness   Dispo: The patient is from: Home              Anticipated d/c is to: Home              Anticipated d/c date is: 2 days              Patient currently is not medically stable to d/c.   TOTAL TIME TAKING CARE OF THIS PATIENT: 55 minutes.    Christel Mormon M.D on 01/12/2020 at 10:40 PM  [facility]   From 7 PM-7 AM, contact night-coverage [email or web address]  CC: Primary care physician; Baxter Hire, MD

## 2020-02-04 NOTE — ED Provider Notes (Signed)
[facility] Emergency Department Provider Note   ____________________________________________   First MD Initiated Contact with Patient 01/15/2020 2132     (approximate)  I have reviewed the triage vital signs and the nursing notes.   HISTORY  Chief Complaint Shortness of Breath    HPI Alfred Clements is a 59 y.o. male who comes in with a fever up to 102 coughing some clear phlegm up with nausea and vomiting and diarrhea and low sats in the 86 range.  Patient has also been having aches and pains.  Patient son tested positive for Covid.        Past Medical History:  Diagnosis Date  . Depression   . Hx of colonic polyps   . Hypertension     Patient Active Problem List   Diagnosis Date Noted  . Atypical pneumonia 01/24/2020    Past Surgical History:  Procedure Laterality Date  . COLONOSCOPY WITH PROPOFOL N/A 07/30/2019   Procedure: COLONOSCOPY WITH PROPOFOL;  Surgeon: Robert Bellow, MD;  Location: [facility] ENDOSCOPY;  Service: Endoscopy;  Laterality: N/A;    Prior to Admission medications   Medication Sig Start Date End Date Taking? Authorizing Provider  FLUoxetine (PROZAC) 40 MG capsule Take 40 mg by mouth at bedtime. 12/26/19  Yes [provider]  gabapentin (NEURONTIN) 300 MG capsule Take 300 mg by mouth at bedtime.   Yes [provider]  hydrOXYzine (VISTARIL) 25 MG capsule Take 25 mg by mouth 2 (two) times daily. 12/26/19  Yes [provider]  Multiple Vitamin (MULTIVITAMIN) tablet Take 1 tablet by mouth daily.   Yes [provider]  naproxen (NAPROSYN) 500 MG tablet Take 1 tablet (500 mg total) by mouth 2 (two) times daily with a meal. 01/11/16  Yes Lavonia Drafts, MD    Allergies Penicillins  No family history on file.  Social History Social History   Tobacco Use  . Smoking status: Never Smoker  . Smokeless tobacco: Never Used  Substance Use Topics  . Alcohol use: Never  . Drug use: Never     Review of Systems  Constitutional:  fever/chills Eyes: No visual changes. ENT: No sore throat. Cardiovascular: Denies chest pain. Respiratory:  shortness of breath. Gastrointestinal: No abdominal pain.   nausea,  vomiting.   diarrhea.  No constipation. Genitourinary: Negative for dysuria. Musculoskeletal: Negative for back pain. Skin: Negative for rash. Neurological: Negative for headaches, focal weakness .   ____________________________________________   PHYSICAL EXAM:  VITAL SIGNS: ED Triage Vitals  Enc Vitals Group     BP 01/22/2020 1816 130/84     Pulse Rate 02/08/2020 1816 93     Resp 02/10/2020 1816 (!) 24     Temp 01/26/2020 1816 (!) 103.4 F (39.7 C)     Temp Source 01/28/2020 1816 Oral     SpO2 02/07/2020 1816 93 %     Weight 02/03/2020 1817 217 lb (98.4 kg)     Height 02/05/2020 1817 5' 9.5" (1.765 m)     Head Circumference --      Peak Flow --      Pain Score 02/05/2020 1816 10     Pain Loc --      Pain Edu? --      Excl. in [place]? --     Constitutional: Alert and oriented.  Looks ill. Eyes: Conjunctivae are normal.  Head: Atraumatic. Nose: No congestion/rhinnorhea. Mouth/Throat: Mucous membranes are moist.  Oropharynx non-erythematous. Neck: No stridor.   Cardiovascular: Normal rate, regular rhythm. Grossly  normal heart sounds.  Good peripheral circulation. Respiratory: Normal respiratory effort.  No retractions. Lungs scattered crackles throughout especially in the right middle lung field Gastrointestinal: Soft mildly diffusely tender no distention. No abdominal bruits.  Musculoskeletal: No lower extremity tenderness nor edema.   Neurologic:  Normal speech and language. No gross focal neurologic deficits are appreciated. No gait instability. Skin:  Skin is warm, dry and intact.    ____________________________________________   LABS (all labs ordered are listed, but only abnormal results are displayed)  Labs Reviewed  COMPREHENSIVE METABOLIC PANEL - Abnormal;  Notable for the following components:      Result Value   Sodium 129 (*)    Potassium 3.4 (*)    Chloride 90 (*)    Glucose, Bld 123 (*)    Creatinine, Ser 1.34 (*)    Calcium 8.6 (*)    Total Protein 8.5 (*)    AST 56 (*)    All other components within normal limits  TROPONIN I (HIGH SENSITIVITY) - Abnormal; Notable for the following components:   Troponin I (High Sensitivity) 26 (*)    All other components within normal limits  TROPONIN I (HIGH SENSITIVITY) - Abnormal; Notable for the following components:   Troponin I (High Sensitivity) 32 (*)    All other components within normal limits  RESP PANEL BY RT-PCR (FLU A&B, COVID) ARPGX2  CBC WITH DIFFERENTIAL/PLATELET  BRAIN NATRIURETIC PEPTIDE  PROCALCITONIN   ____________________________________________  EKG EKG read interpreted by me shows normal sinus rhythm rate of 95 left axis some premature atrial contractions.  Almost no R wave progression in the chest leads  ____________________________________________  RADIOLOGY Gertha Calkin, personally viewed and evaluated these images (plain radiographs) as part of my medical decision making, as well as reviewing the written report by the radiologist.  ED MD interpretation: Patient's chest x-ray most suggestive of atypical pneumonia.  This is especially true in view of his exposure to Covid in his 102 fever.  Official radiology report(s): DG Chest 2 View  Result Date: 02/10/2020 CLINICAL DATA:  59 year old male with shortness of breath. EXAM: CHEST - 2 VIEW COMPARISON:  None FINDINGS: Bilateral streaky pulmonary densities may represent edema but concerning for atypical infection. Clinical correlation is recommended. No focal consolidation, pleural effusion, pneumothorax. The cardiac silhouette is within limits. No acute osseous pathology. IMPRESSION: Findings may represent edema but concerning for atypical infection. Clinical correlation is recommended. Electronically Signed    By: Anner Crete M.D.   On: 02/09/2020 19:26    ____________________________________________   PROCEDURES  Procedure(s) performed (including Critical Care):  Procedures   ____________________________________________   INITIAL IMPRESSION / ASSESSMENT AND PLAN / ED COURSE  Patient with minimally productive cough high fever shortness of breath and hypoxia with a low sodium and a normal white count will need to come in the hospital.  Likely he has Covid.  I am waiting for the Covid test to come back.  Additionally his troponin is rising was 26 at 1821 and 32 at 2100.    ----------------------------------------- 11:30 PM on 01/27/2020 -----------------------------------------  Covid test is still not back however with the low white blood count and the diffuse infiltrates on both lungs high fever and a negative BNP it does not look like anything but Covid at this point.  Covid test will be back shortly we will plan on admitting the patient.  I will not give him antibiotics at this point because of the very high likelihood of Covid.  ____________________________________________   FINAL CLINICAL IMPRESSION(S) / ED DIAGNOSES  Final diagnoses:  Community acquired pneumonia, unspecified laterality  Hypoxia     ED Discharge Orders    None      *Please note:  Zaven Klemens was evaluated in Emergency Department on 01/23/2020 for the symptoms described in the history of present illness. He was evaluated in the context of the global COVID-19 pandemic, which necessitated consideration that the patient might be at risk for infection with the SARS-CoV-2 virus that causes COVID-19. Institutional protocols and algorithms that pertain to the evaluation of patients at risk for COVID-19 are in a state of rapid change based on information released by regulatory bodies including the CDC and federal and state organizations. These policies and algorithms were followed during the  patient's care in the ED.  Some ED evaluations and interventions may be delayed as a result of limited staffing during and the pandemic.*   Note:  This document was prepared using Dragon voice recognition software and may include unintentional dictation errors.    Nena Polio, MD 01/28/2020 2330

## 2020-02-04 NOTE — ED Notes (Signed)
ED Provider at bedside. 

## 2020-02-04 NOTE — ED Notes (Signed)
Spoke with Lab , they advised they are running Covid test

## 2020-02-05 ENCOUNTER — Other Ambulatory Visit: Payer: Self-pay

## 2020-02-05 DIAGNOSIS — J1282 Pneumonia due to coronavirus disease 2019: Secondary | ICD-10-CM | POA: Diagnosis not present

## 2020-02-05 DIAGNOSIS — I4891 Unspecified atrial fibrillation: Secondary | ICD-10-CM | POA: Diagnosis not present

## 2020-02-05 DIAGNOSIS — U071 COVID-19: Secondary | ICD-10-CM | POA: Diagnosis not present

## 2020-02-05 DIAGNOSIS — J9601 Acute respiratory failure with hypoxia: Secondary | ICD-10-CM | POA: Diagnosis not present

## 2020-02-05 LAB — LACTATE DEHYDROGENASE: LDH: 398 U/L — ABNORMAL HIGH (ref 98–192)

## 2020-02-05 LAB — MAGNESIUM: Magnesium: 2.4 mg/dL (ref 1.7–2.4)

## 2020-02-05 LAB — FIBRIN DERIVATIVES D-DIMER (ARMC ONLY): Fibrin derivatives D-dimer (ARMC): 1084.31 ng/mL (FEU) — ABNORMAL HIGH (ref 0.00–499.00)

## 2020-02-05 LAB — FERRITIN: Ferritin: 908 ng/mL — ABNORMAL HIGH (ref 24–336)

## 2020-02-05 LAB — C-REACTIVE PROTEIN: CRP: 25.1 mg/dL — ABNORMAL HIGH (ref <1.0)

## 2020-02-05 LAB — PROCALCITONIN: Procalcitonin: 0.2 ng/mL

## 2020-02-05 LAB — HIV ANTIBODY (ROUTINE TESTING W REFLEX): HIV Screen 4th Generation wRfx: NONREACTIVE

## 2020-02-05 MED ORDER — ENOXAPARIN SODIUM 60 MG/0.6ML ~~LOC~~ SOLN
0.5000 mg/kg | SUBCUTANEOUS | Status: DC
  Filled 2020-02-05: qty 0.6

## 2020-02-05 MED ORDER — ONDANSETRON HCL 4 MG PO TABS
4.0000 mg | ORAL_TABLET | Freq: Four times a day (QID) | ORAL | Status: DC | PRN
  Administered 2020-02-13: 4 mg via ORAL
  Filled 2020-02-05 (×2): qty 1

## 2020-02-05 MED ORDER — VITAMIN D 25 MCG (1000 UNIT) PO TABS
1000.0000 [IU] | ORAL_TABLET | Freq: Every day | ORAL | Status: DC
  Administered 2020-02-05 – 2020-02-09 (×5): 1000 [IU] via ORAL
  Filled 2020-02-05 (×5): qty 1

## 2020-02-05 MED ORDER — FAMOTIDINE 20 MG PO TABS
20.0000 mg | ORAL_TABLET | Freq: Two times a day (BID) | ORAL | Status: DC
  Administered 2020-02-05 – 2020-02-21 (×32): 20 mg via ORAL
  Filled 2020-02-05 (×34): qty 1

## 2020-02-05 MED ORDER — DILTIAZEM HCL-DEXTROSE 125-5 MG/125ML-% IV SOLN (PREMIX)
5.0000 mg/h | INTRAVENOUS | Status: DC
  Administered 2020-02-05: 5 mg/h via INTRAVENOUS
  Filled 2020-02-05: qty 125

## 2020-02-05 MED ORDER — BARICITINIB 1 MG PO TABS
4.0000 mg | ORAL_TABLET | Freq: Every day | ORAL | Status: DC
  Administered 2020-02-05: 4 mg via ORAL
  Filled 2020-02-05: qty 1
  Filled 2020-02-05: qty 2

## 2020-02-05 MED ORDER — APIXABAN 5 MG PO TABS
5.0000 mg | ORAL_TABLET | Freq: Two times a day (BID) | ORAL | Status: DC
  Administered 2020-02-05 – 2020-02-21 (×32): 5 mg via ORAL
  Filled 2020-02-05 (×20): qty 1
  Filled 2020-02-05: qty 2
  Filled 2020-02-05 (×3): qty 1
  Filled 2020-02-05: qty 2
  Filled 2020-02-05 (×6): qty 1
  Filled 2020-02-05: qty 2

## 2020-02-05 MED ORDER — SODIUM CHLORIDE 0.9 % IV SOLN
200.0000 mg | Freq: Once | INTRAVENOUS | Status: AC
  Administered 2020-02-05: 200 mg via INTRAVENOUS
  Filled 2020-02-05: qty 40

## 2020-02-05 MED ORDER — ACETAMINOPHEN 325 MG PO TABS
650.0000 mg | ORAL_TABLET | Freq: Four times a day (QID) | ORAL | Status: DC | PRN
  Administered 2020-02-11: 650 mg via ORAL
  Filled 2020-02-05: qty 2

## 2020-02-05 MED ORDER — GUAIFENESIN-DM 100-10 MG/5ML PO SYRP
10.0000 mL | ORAL_SOLUTION | ORAL | Status: DC | PRN
  Administered 2020-02-06 – 2020-02-16 (×2): 10 mL via ORAL
  Filled 2020-02-05 (×3): qty 10

## 2020-02-05 MED ORDER — SODIUM CHLORIDE 0.9 % IV SOLN: INTRAVENOUS | Status: DC

## 2020-02-05 MED ORDER — METOPROLOL TARTRATE 5 MG/5ML IV SOLN
5.0000 mg | INTRAVENOUS | Status: AC | PRN
  Administered 2020-02-05 (×2): 5 mg via INTRAVENOUS
  Filled 2020-02-05 (×2): qty 5

## 2020-02-05 MED ORDER — IPRATROPIUM-ALBUTEROL 20-100 MCG/ACT IN AERS
1.0000 | INHALATION_SPRAY | Freq: Four times a day (QID) | RESPIRATORY_TRACT | Status: DC
  Administered 2020-02-06 – 2020-02-21 (×59): 1 via RESPIRATORY_TRACT
  Filled 2020-02-05 (×3): qty 4

## 2020-02-05 MED ORDER — AMIODARONE HCL IN DEXTROSE 360-4.14 MG/200ML-% IV SOLN
60.0000 mg/h | INTRAVENOUS | Status: AC
  Administered 2020-02-05: 60 mg/h via INTRAVENOUS
  Filled 2020-02-05 (×2): qty 200

## 2020-02-05 MED ORDER — GUAIFENESIN ER 600 MG PO TB12
600.0000 mg | ORAL_TABLET | Freq: Two times a day (BID) | ORAL | Status: DC
  Administered 2020-02-05 – 2020-02-21 (×32): 600 mg via ORAL
  Filled 2020-02-05 (×34): qty 1

## 2020-02-05 MED ORDER — MAGNESIUM HYDROXIDE 400 MG/5ML PO SUSP
30.0000 mL | Freq: Every day | ORAL | Status: DC | PRN
  Administered 2020-02-18: 30 mL via ORAL
  Filled 2020-02-05: qty 30

## 2020-02-05 MED ORDER — ZINC SULFATE 220 (50 ZN) MG PO CAPS
220.0000 mg | ORAL_CAPSULE | Freq: Every day | ORAL | Status: DC
  Administered 2020-02-05 – 2020-02-21 (×16): 220 mg via ORAL
  Filled 2020-02-05 (×17): qty 1

## 2020-02-05 MED ORDER — AMIODARONE LOAD VIA INFUSION
150.0000 mg | Freq: Once | INTRAVENOUS | Status: AC
  Administered 2020-02-05: 150 mg via INTRAVENOUS
  Filled 2020-02-05: qty 83.34

## 2020-02-05 MED ORDER — ENOXAPARIN SODIUM 40 MG/0.4ML ~~LOC~~ SOLN: 40.0000 mg | SUBCUTANEOUS | Status: DC

## 2020-02-05 MED ORDER — ASPIRIN EC 81 MG PO TBEC
81.0000 mg | DELAYED_RELEASE_TABLET | Freq: Every day | ORAL | Status: DC
  Administered 2020-02-05 – 2020-02-21 (×17): 81 mg via ORAL
  Filled 2020-02-05 (×17): qty 1

## 2020-02-05 MED ORDER — PREDNISONE 50 MG PO TABS: 50.0000 mg | ORAL_TABLET | Freq: Every day | ORAL | Status: DC

## 2020-02-05 MED ORDER — DILTIAZEM HCL 25 MG/5ML IV SOLN
10.0000 mg | Freq: Once | INTRAVENOUS | Status: AC
  Administered 2020-02-05: 10 mg via INTRAVENOUS
  Filled 2020-02-05: qty 5

## 2020-02-05 MED ORDER — AMIODARONE HCL IN DEXTROSE 360-4.14 MG/200ML-% IV SOLN
30.0000 mg/h | INTRAVENOUS | Status: DC
  Administered 2020-02-05 – 2020-02-10 (×9): 30 mg/h via INTRAVENOUS
  Filled 2020-02-05 (×10): qty 200

## 2020-02-05 MED ORDER — HYDROCOD POLST-CPM POLST ER 10-8 MG/5ML PO SUER
5.0000 mL | Freq: Two times a day (BID) | ORAL | Status: DC | PRN
  Administered 2020-02-07 – 2020-02-15 (×7): 5 mL via ORAL
  Filled 2020-02-05 (×7): qty 5

## 2020-02-05 MED ORDER — METHYLPREDNISOLONE SODIUM SUCC 125 MG IJ SOLR
1.0000 mg/kg | Freq: Two times a day (BID) | INTRAMUSCULAR | Status: DC
  Administered 2020-02-05 – 2020-02-06 (×4): 98.125 mg via INTRAVENOUS
  Filled 2020-02-05 (×4): qty 2

## 2020-02-05 MED ORDER — ASCORBIC ACID 500 MG PO TABS
500.0000 mg | ORAL_TABLET | Freq: Every day | ORAL | Status: DC
  Administered 2020-02-05 – 2020-02-21 (×15): 500 mg via ORAL
  Filled 2020-02-05 (×18): qty 1

## 2020-02-05 MED ORDER — SODIUM CHLORIDE 0.9 % IV SOLN
100.0000 mg | Freq: Every day | INTRAVENOUS | Status: AC
  Administered 2020-02-06 – 2020-02-09 (×4): 100 mg via INTRAVENOUS
  Filled 2020-02-05 (×4): qty 100

## 2020-02-05 MED ORDER — ONDANSETRON HCL 4 MG/2ML IJ SOLN
4.0000 mg | Freq: Four times a day (QID) | INTRAMUSCULAR | Status: DC | PRN
  Administered 2020-02-05 – 2020-02-18 (×8): 4 mg via INTRAVENOUS
  Filled 2020-02-05 (×8): qty 2

## 2020-02-05 MED ORDER — TRAZODONE HCL 50 MG PO TABS
25.0000 mg | ORAL_TABLET | Freq: Every evening | ORAL | Status: DC | PRN
  Administered 2020-02-18 – 2020-02-19 (×2): 25 mg via ORAL
  Filled 2020-02-05 (×3): qty 1

## 2020-02-05 MED ORDER — BARICITINIB 2 MG PO TABS
4.0000 mg | ORAL_TABLET | Freq: Every day | ORAL | Status: AC
  Administered 2020-02-06 – 2020-02-18 (×13): 4 mg via ORAL
  Filled 2020-02-05 (×14): qty 2

## 2020-02-05 MED ORDER — METOPROLOL TARTRATE 5 MG/5ML IV SOLN
INTRAVENOUS | Status: AC
  Filled 2020-02-05: qty 5

## 2020-02-05 NOTE — ED Notes (Signed)
Admit MD at bedside

## 2020-02-05 NOTE — Progress Notes (Signed)
PROGRESS NOTE    Alfred Clements  TDD:[id number] DOB: [birth year] DOA: 01/25/2020 PCP: Baxter Hire, MD    Assessment & Plan:   Active Problems:   Atypical pneumonia    Alfred Clements  is a 59 y.o. Hispanic male with a known history of depression and hypertension, who presented to the emergency room with acute onset of dyspnea and fever as well as dry cough and wheezing since Monday.  He admits to fever and chills as well as loss of taste and smell.  He has been having fatigue and tiredness.  He admitted to diarrhea with bright red bleeding per rectum or melena.  No nausea or vomiting.  He has been exposed to his son who was diagnosed with COVID-19 a couple days ago.  2 daughters also have similar symptoms.  The patient was noted to have hypoxemia with pulse oximetry has been ranging 88 to 89% on room air.  The patient has not been vaccinated for COVID-19.  1. Acute hypoxemic respiratory failure secondary to COVID-19. --documented O2 sat 86% on room air, was put on 3L.  O2 requirement has since increased to needing heated hf. --Continue supplemental O2 to keep sats >=90%, wean as tolerated  2. Multifocal pneumonia secondary to COVID-19. --CXR showed "Bilateral streaky pulmonary densities".  BNP 40. PLAN: --cont Remdesivir --cont steroid as solumedrol --trend CRP --cont Baricitinib  --start Combivent QID  # Afib w RVR --appeared to have developed after presentation. --s/p IV dilt 10 mg f/b dilt gtt@ 15 in the ED, but still not rate controlled. PLAN: --start amiodarone bolus f/b gtt, due to BP on the low side. --If Afib persists, will discuss anticoagulation for primary stroke prevention.  3.  Hypokalemia. -replete PRN --check mag level  4.  acute kidney injury, ruled out --Cr 1.34 on presentation.  Baseline appeared to be around 1.1.  Does not meet criteria for AKI  Hyponatremia --was started on MIVF on admission --d/c MIVF and encourage oral hydration.  5.  Hx  of Essential hypertension, not currently active --not on home BP meds. --BP currently on the low side.  6.  Depression. Continue Prozac.   DVT prophylaxis: Lovenox SQ Code Status: Full code  Family Communication:  Status is: inpatient Dispo:   The patient is from: home Anticipated d/c is to: home Anticipated d/c date is: > 3 days Patient currently is not medically stable to d/c due to: covid with high O2 requirement   Subjective and Interval History:  Reported diarrhea.  Denied abdominal pain.  In Afib RVR.   Objective: Vitals:   02/05/20 1300 02/05/20 1310 02/05/20 1355 02/05/20 1400  BP: 117/87  136/83 97/71  Pulse: (!) 123 (!) 31 62 82  Resp: (!) 30 (!) 32 (!) 26 (!) 25  Temp:   98 F (36.7 C) 98 F (36.7 C)  TempSrc:   Oral Oral  SpO2: 90% 91% 91% (!) 88%  Weight:   92.7 kg   Height:   5' 9.5" (1.765 m)     Intake/Output Summary (Last 24 hours) at 02/05/2020 1545 Last data filed at 02/05/2020 0250 Gross per 24 hour  Intake 250 ml  Output --  Net 250 ml   Filed Weights   01/25/2020 1817 02/05/20 1355  Weight: 98.4 kg 92.7 kg    Examination:   Constitutional: NAD, AAOx3 HEENT: conjunctivae and lids normal, EOMI CV: tachycardic, No cyanosis.   RESP: crackles at posterior bases, on 5L GI: abdomen NT ND Extremities: No effusions, edema in  BLE SKIN: warm, dry and intact Neuro: II - XII grossly intact.   Psych: Normal mood and affect.  Appropriate judgement and reason    Data Reviewed: I have personally reviewed following labs and imaging studies  CBC: Recent Labs  Lab 01/14/2020 1821  WBC 6.2  NEUTROABS 5.1  HGB 15.1  HCT 43.8  MCV 92.6  PLT 824   Basic Metabolic Panel: Recent Labs  Lab 01/18/2020 1821  NA 129*  K 3.4*  CL 90*  CO2 26  GLUCOSE 123*  BUN 20  CREATININE 1.34*  CALCIUM 8.6*   GFR: Estimated Creatinine Clearance: 65.7 mL/min (A) (by C-G formula based on SCr of 1.34 mg/dL (H)). Liver Function Tests: Recent Labs  Lab  01/30/2020 1821  AST 56*  ALT 20  ALKPHOS 50  BILITOT 0.6  PROT 8.5*  ALBUMIN 3.7   No results for input(s): LIPASE, AMYLASE in the last 168 hours. No results for input(s): AMMONIA in the last 168 hours. Coagulation Profile: No results for input(s): INR, PROTIME in the last 168 hours. Cardiac Enzymes: No results for input(s): CKTOTAL, CKMB, CKMBINDEX, TROPONINI in the last 168 hours. BNP (last 3 results) No results for input(s): PROBNP in the last 8760 hours. HbA1C: No results for input(s): HGBA1C in the last 72 hours. CBG: No results for input(s): GLUCAP in the last 168 hours. Lipid Profile: No results for input(s): CHOL, HDL, LDLCALC, TRIG, CHOLHDL, LDLDIRECT in the last 72 hours. Thyroid Function Tests: No results for input(s): TSH, T4TOTAL, FREET4, T3FREE, THYROIDAB in the last 72 hours. Anemia Panel: Recent Labs    02/05/20 0049  FERRITIN 908*   Sepsis Labs: Recent Labs  Lab 01/24/2020 2100  PROCALCITON 0.20    Recent Results (from the past 240 hour(s))  Resp Panel by RT-PCR (Flu A&B, Covid) Nasopharyngeal Swab     Status: Abnormal   Collection Time: 01/21/2020  8:59 PM   Specimen: Nasopharyngeal Swab; Nasopharyngeal(NP) swabs in vial transport medium  Result Value Ref Range Status   SARS Coronavirus 2 by RT PCR POSITIVE (A) NEGATIVE Final    Comment: RESULT CALLED TO, READ BACK BY AND VERIFIED WITH: MCKENZIE WALLACE @2333  ON 02/01/2020 SKL (NOTE) SARS-CoV-2 target nucleic acids are DETECTED.  The SARS-CoV-2 RNA is generally detectable in upper respiratory specimens during the acute phase of infection. Positive results are indicative of the presence of the identified virus, but do not rule out bacterial infection or co-infection with other pathogens not detected by the test. Clinical correlation with patient history and other diagnostic information is necessary to determine patient infection status. The expected result is Negative.  Fact Sheet for  Patients: EntrepreneurPulse.com.au  Fact Sheet for Healthcare Providers: IncredibleEmployment.be  This test is not yet approved or cleared by the [place] FDA and  has been authorized for detection and/or diagnosis of SARS-CoV-2 by FDA under an Emergency Use Authorization (EUA).  This EUA will remain in effect (meaning this test c an be used) for the duration of  the COVID-19 declaration under Section 564(b)(1) of the Act, 21 U.S.C. section 360bbb-3(b)(1), unless the authorization is terminated or revoked sooner.     Influenza A by PCR NEGATIVE NEGATIVE Final   Influenza B by PCR NEGATIVE NEGATIVE Final    Comment: (NOTE) The Xpert Xpress SARS-CoV-2/FLU/RSV plus assay is intended as an aid in the diagnosis of influenza from Nasopharyngeal swab specimens and should not be used as a sole basis for treatment. Nasal washings and aspirates are unacceptable for Xpert Xpress SARS-CoV-2/FLU/RSV  testing.  Fact Sheet for Patients: EntrepreneurPulse.com.au  Fact Sheet for Healthcare Providers: IncredibleEmployment.be  This test is not yet approved or cleared by the [place] FDA and has been authorized for detection and/or diagnosis of SARS-CoV-2 by FDA under an Emergency Use Authorization (EUA). This EUA will remain in effect (meaning this test can be used) for the duration of the COVID-19 declaration under Section 564(b)(1) of the Act, 21 U.S.C. section 360bbb-3(b)(1), unless the authorization is terminated or revoked.  Performed at [facility], [address] [place], [place] [address]       Radiology Studies: DG Chest 2 View  Result Date: 01/23/2020 CLINICAL DATA:  60 year old male with shortness of breath. EXAM: CHEST - 2 VIEW COMPARISON:  None FINDINGS: Bilateral streaky pulmonary densities may represent edema but concerning for atypical infection. Clinical correlation is recommended.  No focal consolidation, pleural effusion, pneumothorax. The cardiac silhouette is within limits. No acute osseous pathology. IMPRESSION: Findings may represent edema but concerning for atypical infection. Clinical correlation is recommended. Electronically Signed   By: Anner Crete M.D.   On: 01/29/2020 19:26     Scheduled Meds: . amiodarone  150 mg Intravenous Once  . vitamin C  500 mg Oral Daily  . aspirin EC  81 mg Oral Daily  . cholecalciferol  1,000 Units Oral Daily  . enoxaparin (LOVENOX) injection  0.5 mg/kg Subcutaneous Q24H  . famotidine  20 mg Oral BID  . FLUoxetine  40 mg Oral QHS  . gabapentin  300 mg Oral QHS  . guaiFENesin  600 mg Oral BID  . hydrOXYzine  25 mg Oral BID  . methylPREDNISolone (SOLU-MEDROL) injection  1 mg/kg Intravenous BID   Followed by  . [START ON 02/08/2020] predniSONE  50 mg Oral Daily  . metoprolol tartrate      . multivitamin with minerals  1 tablet Oral Daily  . zinc sulfate  220 mg Oral Daily   Continuous Infusions: . amiodarone     Followed by  . amiodarone    . diltiazem (CARDIZEM) infusion 15 mg/hr (02/05/20 1241)  . [START ON 02/06/2020] remdesivir 100 mg in NS 100 mL       LOS: 1 day     Enzo Bi, MD [facility] If 7PM-7AM, please contact night-coverage 02/05/2020, 3:45 PM

## 2020-02-05 NOTE — ED Notes (Signed)
Report called to floor RN

## 2020-02-05 NOTE — ED Notes (Signed)
Pt assisted with ambulation to toilet. Pt HR 134, 85% sats prior to ambulation, now 144 HR, 82% once returned to bed after exertion.

## 2020-02-05 NOTE — ED Notes (Signed)
Patient assisted to toilet for bm with another RN while primary RN was in another room. 1 episode diarrhea reported by RN and patient. Patient noted to desat. To 84% while ambulating. Upon returning pt to bed pt saturation and HR remained unchanged (tachycardic and hypoxic). Patient [place] oxygen increased from 3 L to 4 L and saturation improved to 85-86%. Primary RN encouraged proper breathing and pt remains free from sign of acute physical distress. Pt is speaking in full sentences and with noted symmetric chest rise and fall. HR noted to be 128-137 on cardiac monitor.

## 2020-02-05 NOTE — Progress Notes (Signed)
Remdesivir - Pharmacy Brief Note   A/P:  Remdesivir 200 mg IVPB once followed by 100 mg IVPB daily x 4 days.   Hart Robinsons, PharmD Clinical Pharmacist   02/05/2020 2:44 AM

## 2020-02-05 NOTE — ED Notes (Signed)
Pharmacy contacted requesting verification of ordered meds for administration.

## 2020-02-05 NOTE — ED Notes (Signed)
Patient provided with urinal to void per request.

## 2020-02-05 NOTE — ED Notes (Signed)
MD Mansy notified pt had a run of 3 PVC's on monitor. Awaiting response.

## 2020-02-05 NOTE — ED Notes (Signed)
Respiratory at bedside to place [facility]

## 2020-02-05 NOTE — ED Notes (Signed)
Spoke with admitting physician regarding patient status and potential bed placement.

## 2020-02-05 NOTE — Progress Notes (Signed)
Please call Daughter for update, [phone] two other children are also hospitalized [phone]

## 2020-02-05 NOTE — ED Notes (Signed)
Pt observed by this RN sitting upright in the bed, no distress noted at this time, cont to monitor, pt cont to be in a.fib at this time

## 2020-02-05 NOTE — ED Notes (Signed)
RN called to room for pt reported need for bm. 1 episode diarrhea noted.  Patient assisted to toilet in room and back into bed. Placed back into bed with monitor in place. Bed low and locked with side rails raised x2 and call bell in reach.

## 2020-02-05 NOTE — Progress Notes (Signed)
Followed up with Dr.Lai will cont to monitor pt HR on amnio, will re evaluate if pt systolic BP drop below 90

## 2020-02-05 NOTE — ED Notes (Signed)
Patient provided with warm blanket per request.  

## 2020-02-05 NOTE — ED Notes (Signed)
New EKG obtained and provider notified. Pt acutely tachycardic. Denies chest pain or pressure and patient resting comfortably in bed. No acute distress noted when assessing the patient. Patient also denying worsening sensation of shortness of breath. Awaiting MD response.

## 2020-02-05 NOTE — ED Notes (Signed)
Pharmacy contacted requesting verification of metoprolol for administration.

## 2020-02-06 DIAGNOSIS — J1282 Pneumonia due to coronavirus disease 2019: Secondary | ICD-10-CM | POA: Diagnosis not present

## 2020-02-06 DIAGNOSIS — I4891 Unspecified atrial fibrillation: Secondary | ICD-10-CM | POA: Diagnosis not present

## 2020-02-06 DIAGNOSIS — U071 COVID-19: Secondary | ICD-10-CM | POA: Diagnosis not present

## 2020-02-06 LAB — BASIC METABOLIC PANEL
Anion gap: 14 (ref 5–15)
BUN: 33 mg/dL — ABNORMAL HIGH (ref 8–23)
CO2: 23 mmol/L (ref 22–32)
Calcium: 8.6 mg/dL — ABNORMAL LOW (ref 8.9–10.3)
Chloride: 99 mmol/L (ref 98–111)
Creatinine, Ser: 1.18 mg/dL (ref 0.61–1.24)
GFR, Estimated: >60 mL/min (ref >60)
Glucose, Bld: 196 mg/dL — ABNORMAL HIGH (ref 70–99)
Potassium: 3.6 mmol/L (ref 3.5–5.1)
Sodium: 136 mmol/L (ref 135–145)

## 2020-02-06 LAB — CBC
HCT: 46.7 % (ref 39.0–52.0)
Hemoglobin: 15.9 g/dL (ref 13.0–17.0)
MCH: 31.7 pg (ref 26.0–34.0)
MCHC: 34 g/dL (ref 30.0–36.0)
MCV: 93.2 fL (ref 80.0–100.0)
Platelets: 274 10*3/uL (ref 150–400)
RBC: 5.01 MIL/uL (ref 4.22–5.81)
RDW: 12.4 % (ref 11.5–15.5)
WBC: 10.9 10*3/uL — ABNORMAL HIGH (ref 4.0–10.5)
nRBC: 0 % (ref 0.0–0.2)

## 2020-02-06 LAB — MAGNESIUM: Magnesium: 2.6 mg/dL — ABNORMAL HIGH (ref 1.7–2.4)

## 2020-02-06 LAB — FIBRIN DERIVATIVES D-DIMER (ARMC ONLY): Fibrin derivatives D-dimer (ARMC): 1165.77 ng/mL (FEU) — ABNORMAL HIGH (ref 0.00–499.00)

## 2020-02-06 LAB — C-REACTIVE PROTEIN: CRP: 20.3 mg/dL — ABNORMAL HIGH (ref <1.0)

## 2020-02-06 MED ORDER — METHYLPREDNISOLONE SODIUM SUCC 125 MG IJ SOLR
1.0000 mg/kg | Freq: Three times a day (TID) | INTRAMUSCULAR | Status: DC
  Administered 2020-02-06 – 2020-02-12 (×18): 98.125 mg via INTRAVENOUS
  Filled 2020-02-06 (×18): qty 2

## 2020-02-06 MED ORDER — ENSURE ENLIVE PO LIQD
237.0000 mL | Freq: Two times a day (BID) | ORAL | Status: DC
  Administered 2020-02-06 – 2020-02-14 (×11): 237 mL via ORAL

## 2020-02-06 MED ORDER — ENSURE ENLIVE PO LIQD: 237.0000 mL | Freq: Two times a day (BID) | ORAL | Status: DC

## 2020-02-06 NOTE — Plan of Care (Signed)

## 2020-02-06 NOTE — Care Management Important Message (Signed)
Important Message  Patient Details  Name: Alfred Clements MRN: [id number] Date of Birth: October 23, 1960   Medicare Important Message Given:  N/A - LOS <3 / Initial given by admissions     Dannette Barbara 02/06/2020, 9:18 AM

## 2020-02-06 NOTE — Progress Notes (Signed)
PROGRESS NOTE    Alfred Clements  QQV:[id number] DOB: 03-09-1961 DOA: 01/17/2020 PCP: Baxter Hire, MD    Assessment & Plan:   Active Problems:   Atypical pneumonia    Alfred Clements  is a 59 y.o. Hispanic male with a known history of depression and hypertension, who presented to the emergency room with acute onset of dyspnea and fever as well as dry cough and wheezing since Monday.  He admits to fever and chills as well as loss of taste and smell.  He has been having fatigue and tiredness.  He admitted to diarrhea with bright red bleeding per rectum or melena.  No nausea or vomiting.  He has been exposed to his son who was diagnosed with COVID-19 a couple days ago.  2 daughters also have similar symptoms.  The patient was noted to have hypoxemia with pulse oximetry has been ranging 88 to 89% on room air.  The patient has not been vaccinated for COVID-19.  1. Acute hypoxemic respiratory failure secondary to COVID-19. --documented O2 sat 86% on room air, was put on 3L.  O2 requirement has since increased to needing heated hf. --Continue supplemental O2 to keep sats >88%, wean as tolerated  2. Multifocal pneumonia secondary to COVID-19. --CXR showed "Bilateral streaky pulmonary densities".  BNP 40. PLAN: --cont Remdesivir --cont solumedrol q8h --trend CRP --cont Baricitinib  --cont Combivent QID  # Afib w RVR --appeared to have developed after presentation. --s/p IV dilt 10 mg f/b dilt gtt@ 15 in the ED, but still not rate controlled.  Started on amiodarone bolus f/b gtt.   PLAN: --cont amiodarone gtt --cont Eliquis  3.  Hypokalemia. --monitor and replete PRN  4.  acute kidney injury, ruled out --Cr 1.34 on presentation.  Baseline appeared to be around 1.1.  Does not meet criteria for AKI  Hyponatremia --was started on MIVF on admission, since d/c'ed. --encourage oral hydration.  5.  Hx of Essential hypertension, not currently active --not on home BP  meds. --BP currently on the low side.  6.  Depression. Continue Prozac.   DVT prophylaxis: Lovenox SQ Code Status: Full code  Family Communication:  Status is: inpatient Dispo:   The patient is from: home Anticipated d/c is to: home Anticipated d/c date is: > 3 days Patient currently is not medically stable to d/c due to: covid on heated hf   Subjective and Interval History:  Pt reported feeling much better.  No more diarrhea.  No appetite though.   Objective: Vitals:   02/06/20 1200 02/06/20 1217 02/06/20 1300 02/06/20 1400  BP:  118/79    Pulse: (!) 149 (!) 48 (!) 131 73  Resp: (!) 27 (!) 31 15 (!) 29  Temp:  (!) 97.5 F (36.4 C)    TempSrc:  Oral    SpO2: 91% 90% 97% 93%  Weight:      Height:        Intake/Output Summary (Last 24 hours) at 02/06/2020 1444 Last data filed at 02/06/2020 0438 Gross per 24 hour  Intake --  Output 700 ml  Net -700 ml   Filed Weights   01/14/2020 1817 02/05/20 1355 02/06/20 0424  Weight: 98.4 kg 92.7 kg 93 kg    Examination:   Constitutional: NAD, AAOx3 HEENT: conjunctivae and lids normal, EOMI CV: irregular, tachycardic, No cyanosis.   RESP: on heated hf, no respiratory distress Extremities: No effusions, edema in BLE SKIN: warm, dry and intact Neuro: II - XII grossly intact.   Psych:  Normal mood and affect.  Appropriate judgement and reason    Data Reviewed: I have personally reviewed following labs and imaging studies  CBC: Recent Labs  Lab 01/17/2020 1821 02/06/20 0426  WBC 6.2 10.9*  NEUTROABS 5.1  --   HGB 15.1 15.9  HCT 43.8 46.7  MCV 92.6 93.2  PLT 188 063   Basic Metabolic Panel: Recent Labs  Lab 01/27/2020 1821 02/05/20 1417 02/06/20 0426  NA 129*  --  136  K 3.4*  --  3.6  CL 90*  --  99  CO2 26  --  23  GLUCOSE 123*  --  196*  BUN 20  --  33*  CREATININE 1.34*  --  1.18  CALCIUM 8.6*  --  8.6*  MG  --  2.4 2.6*   GFR: Estimated Creatinine Clearance: 74.7 mL/min (by C-G formula based on  SCr of 1.18 mg/dL). Liver Function Tests: Recent Labs  Lab 01/24/2020 1821  AST 56*  ALT 20  ALKPHOS 50  BILITOT 0.6  PROT 8.5*  ALBUMIN 3.7   No results for input(s): LIPASE, AMYLASE in the last 168 hours. No results for input(s): AMMONIA in the last 168 hours. Coagulation Profile: No results for input(s): INR, PROTIME in the last 168 hours. Cardiac Enzymes: No results for input(s): CKTOTAL, CKMB, CKMBINDEX, TROPONINI in the last 168 hours. BNP (last 3 results) No results for input(s): PROBNP in the last 8760 hours. HbA1C: No results for input(s): HGBA1C in the last 72 hours. CBG: No results for input(s): GLUCAP in the last 168 hours. Lipid Profile: No results for input(s): CHOL, HDL, LDLCALC, TRIG, CHOLHDL, LDLDIRECT in the last 72 hours. Thyroid Function Tests: No results for input(s): TSH, T4TOTAL, FREET4, T3FREE, THYROIDAB in the last 72 hours. Anemia Panel: Recent Labs    02/05/20 0049  FERRITIN 908*   Sepsis Labs: Recent Labs  Lab 01/13/2020 2100  PROCALCITON 0.20    Recent Results (from the past 240 hour(s))  Resp Panel by RT-PCR (Flu A&B, Covid) Nasopharyngeal Swab     Status: Abnormal   Collection Time: 02/05/2020  8:59 PM   Specimen: Nasopharyngeal Swab; Nasopharyngeal(NP) swabs in vial transport medium  Result Value Ref Range Status   SARS Coronavirus 2 by RT PCR POSITIVE (A) NEGATIVE Final    Comment: RESULT CALLED TO, READ BACK BY AND VERIFIED WITH: MCKENZIE WALLACE @2333  ON 02/03/2020 SKL (NOTE) SARS-CoV-2 target nucleic acids are DETECTED.  The SARS-CoV-2 RNA is generally detectable in upper respiratory specimens during the acute phase of infection. Positive results are indicative of the presence of the identified virus, but do not rule out bacterial infection or co-infection with other pathogens not detected by the test. Clinical correlation with patient history and other diagnostic information is necessary to determine patient infection status. The  expected result is Negative.  Fact Sheet for Patients: EntrepreneurPulse.com.au  Fact Sheet for Healthcare Providers: IncredibleEmployment.be  This test is not yet approved or cleared by the [place] FDA and  has been authorized for detection and/or diagnosis of SARS-CoV-2 by FDA under an Emergency Use Authorization (EUA).  This EUA will remain in effect (meaning this test c an be used) for the duration of  the COVID-19 declaration under Section 564(b)(1) of the Act, 21 U.S.C. section 360bbb-3(b)(1), unless the authorization is terminated or revoked sooner.     Influenza A by PCR NEGATIVE NEGATIVE Final   Influenza B by PCR NEGATIVE NEGATIVE Final    Comment: (NOTE) The Xpert Xpress SARS-CoV-2/FLU/RSV plus  assay is intended as an aid in the diagnosis of influenza from Nasopharyngeal swab specimens and should not be used as a sole basis for treatment. Nasal washings and aspirates are unacceptable for Xpert Xpress SARS-CoV-2/FLU/RSV testing.  Fact Sheet for Patients: EntrepreneurPulse.com.au  Fact Sheet for Healthcare Providers: IncredibleEmployment.be  This test is not yet approved or cleared by the [place] FDA and has been authorized for detection and/or diagnosis of SARS-CoV-2 by FDA under an Emergency Use Authorization (EUA). This EUA will remain in effect (meaning this test can be used) for the duration of the COVID-19 declaration under Section 564(b)(1) of the Act, 21 U.S.C. section 360bbb-3(b)(1), unless the authorization is terminated or revoked.  Performed at [facility], [address] [place], [place] [address]       Radiology Studies: DG Chest 2 View  Result Date: 01/16/2020 CLINICAL DATA:  59 year old male with shortness of breath. EXAM: CHEST - 2 VIEW COMPARISON:  None FINDINGS: Bilateral streaky pulmonary densities may represent edema but concerning for atypical  infection. Clinical correlation is recommended. No focal consolidation, pleural effusion, pneumothorax. The cardiac silhouette is within limits. No acute osseous pathology. IMPRESSION: Findings may represent edema but concerning for atypical infection. Clinical correlation is recommended. Electronically Signed   By: Anner Crete M.D.   On: 01/17/2020 19:26     Scheduled Meds: . apixaban  5 mg Oral BID  . vitamin C  500 mg Oral Daily  . aspirin EC  81 mg Oral Daily  . baricitinib  4 mg Oral Daily  . cholecalciferol  1,000 Units Oral Daily  . famotidine  20 mg Oral BID  . FLUoxetine  40 mg Oral QHS  . gabapentin  300 mg Oral QHS  . guaiFENesin  600 mg Oral BID  . hydrOXYzine  25 mg Oral BID  . Ipratropium-Albuterol  1 puff Inhalation QID  . methylPREDNISolone (SOLU-MEDROL) injection  1 mg/kg Intravenous Q8H  . multivitamin with minerals  1 tablet Oral Daily  . zinc sulfate  220 mg Oral Daily   Continuous Infusions: . amiodarone 30 mg/hr (02/06/20 0511)  . diltiazem (CARDIZEM) infusion Stopped (02/05/20 1730)  . remdesivir 100 mg in NS 100 mL 100 mg (02/06/20 1041)     LOS: 2 days     Enzo Bi, MD [facility] If 7PM-7AM, please contact night-coverage 02/06/2020, 2:44 PM

## 2020-02-06 NOTE — Plan of Care (Signed)
Pt has tolerated sitting up in bed. Pt choked on food during breakfast due to coughing. Pt hr increased along with rr. Pt was able to return to his baseline with an increase in heated flow O2 and taking a few moments to collect himself. Pt refused breakfast and lunch due to being scared of choking again. Convinced pt to order mashed potatoes for dinner so that he could ingest some sort of nutrition. Pt has asked great questions about medications he is taking. Pt demonstrated understanding of inhaler use. Pt uses urinal. Pt has not had a bowel movement today.   Problem: Education: Goal: Knowledge of General Education information will improve Description: Including pain rating scale, medication(s)/side effects and non-pharmacologic comfort measures Outcome: Progressing   Problem: Health Behavior/Discharge Planning: Goal: Ability to manage health-related needs will improve Outcome: Progressing   Problem: Clinical Measurements: Goal: Will remain free from infection Outcome: Progressing Goal: Respiratory complications will improve Outcome: Progressing Goal: Cardiovascular complication will be avoided Outcome: Progressing   Problem: Nutrition: Goal: Adequate nutrition will be maintained Outcome: Progressing   Problem: Coping: Goal: Level of anxiety will decrease Outcome: Progressing   Problem: Elimination: Goal: Will not experience complications related to bowel motility Outcome: Progressing Goal: Will not experience complications related to urinary retention Outcome: Progressing   Problem: Pain Managment: Goal: General experience of comfort will improve Outcome: Progressing   Problem: Safety: Goal: Ability to remain free from injury will improve Outcome: Progressing   Problem: Skin Integrity: Goal: Risk for impaired skin integrity will decrease Outcome: Progressing   Problem: Education: Goal: Knowledge of risk factors and measures for prevention of condition will  improve Outcome: Progressing   Problem: Coping: Goal: Psychosocial and spiritual needs will be supported Outcome: Progressing   Problem: Respiratory: Goal: Will maintain a patent airway Outcome: Progressing Goal: Complications related to the disease process, condition or treatment will be avoided or minimized Outcome: Progressing

## 2020-02-07 DIAGNOSIS — J1282 Pneumonia due to coronavirus disease 2019: Secondary | ICD-10-CM | POA: Diagnosis not present

## 2020-02-07 DIAGNOSIS — U071 COVID-19: Secondary | ICD-10-CM | POA: Diagnosis not present

## 2020-02-07 LAB — BASIC METABOLIC PANEL
Anion gap: 13 (ref 5–15)
BUN: 37 mg/dL — ABNORMAL HIGH (ref 8–23)
CO2: 26 mmol/L (ref 22–32)
Calcium: 8.6 mg/dL — ABNORMAL LOW (ref 8.9–10.3)
Chloride: 98 mmol/L (ref 98–111)
Creatinine, Ser: 1.32 mg/dL — ABNORMAL HIGH (ref 0.61–1.24)
GFR, Estimated: >60 mL/min (ref >60)
Glucose, Bld: 203 mg/dL — ABNORMAL HIGH (ref 70–99)
Potassium: 4.1 mmol/L (ref 3.5–5.1)
Sodium: 137 mmol/L (ref 135–145)

## 2020-02-07 LAB — CBC
HCT: 47.1 % (ref 39.0–52.0)
Hemoglobin: 15.9 g/dL (ref 13.0–17.0)
MCH: 31.9 pg (ref 26.0–34.0)
MCHC: 33.8 g/dL (ref 30.0–36.0)
MCV: 94.4 fL (ref 80.0–100.0)
Platelets: 357 10*3/uL (ref 150–400)
RBC: 4.99 MIL/uL (ref 4.22–5.81)
RDW: 12.7 % (ref 11.5–15.5)
WBC: 15.2 10*3/uL — ABNORMAL HIGH (ref 4.0–10.5)
nRBC: 0 % (ref 0.0–0.2)

## 2020-02-07 LAB — MAGNESIUM: Magnesium: 3 mg/dL — ABNORMAL HIGH (ref 1.7–2.4)

## 2020-02-07 LAB — C-REACTIVE PROTEIN: CRP: 11 mg/dL — ABNORMAL HIGH (ref <1.0)

## 2020-02-07 MED ORDER — AMIODARONE LOAD VIA INFUSION
150.0000 mg | Freq: Once | INTRAVENOUS | Status: AC
  Administered 2020-02-07: 150 mg via INTRAVENOUS
  Filled 2020-02-07: qty 83.34

## 2020-02-07 MED ORDER — AMIODARONE IV BOLUS ONLY 150 MG/100ML
150.0000 mg | Freq: Once | INTRAVENOUS | Status: DC
Start: 2020-02-07 — End: 2020-02-07

## 2020-02-07 NOTE — Progress Notes (Signed)
PROGRESS NOTE    Alfred Clements  ATF:[id number] DOB: 1960-04-21 DOA: 01/27/2020 PCP: Baxter Hire, MD    Assessment & Plan:   Active Problems:   Atypical pneumonia    Alfred Clements  is a 59 y.o. Hispanic male with a known history of depression and hypertension, who presented to the emergency room with acute onset of dyspnea and fever as well as dry cough and wheezing since Monday.  He admits to fever and chills as well as loss of taste and smell.  He has been having fatigue and tiredness.  He admitted to diarrhea with bright red bleeding per rectum or melena.  No nausea or vomiting.  He has been exposed to his son who was diagnosed with COVID-19 a couple days ago.  2 daughters also have similar symptoms.  The patient was noted to have hypoxemia with pulse oximetry has been ranging 88 to 89% on room air.  The patient has not been vaccinated for COVID-19.  1. Acute hypoxemic respiratory failure secondary to COVID-19. --documented O2 sat 86% on room air, was put on 3L.  O2 requirement has since increased to needing heated hf. --Continue supplemental O2 to keep sats >88%, wean as tolerated  2. Multifocal pneumonia secondary to COVID-19. --CXR showed "Bilateral streaky pulmonary densities".  BNP 40. --CRP 25.1 on presentation, started on solumedrol, trending down PLAN: --cont Remdesivir --cont solumedrol q8h --trend CRP --cont Baricitinib  --cont Combivent QID  # Afib w RVR --appeared to have developed after presentation. --s/p IV dilt 10 mg f/b dilt gtt@ 15 in the ED, but still not rate controlled.  Started on amiodarone bolus f/b gtt.   --still not rate controlled today.  BP intermittently soft. PLAN: --cardiology consult --repeat amiodarone bolus today, per cards. --cont Eliquis in case pt needs cardioversion  3.  Hypokalemia. --monitor and replete PRN  4.  acute kidney injury, ruled out --Cr 1.34 on presentation.  Baseline appeared to be around 1.1.  Does not  meet criteria for AKI  Hyponatremia --was started on MIVF on admission, since d/c'ed. --encourage oral hydration.  5.  Hx of Essential hypertension, not currently active --not on home BP meds. --BP currently on the low side.  6.  Depression. Continue Prozac.  GERD --cont Pepcid   DVT prophylaxis: Lovenox SQ Code Status: Full code  Family Communication:  Status is: inpatient Dispo:   The patient is from: home Anticipated d/c is to: home Anticipated d/c date is: > 3 days Patient currently is not medically stable to d/c due to: covid on heated hf   Subjective and Interval History:  Pt reported congestion.  No chest pain.  No N/V/D.  No appetite.  Still in Afib w RVR.     Objective: Vitals:   02/07/20 0923 02/07/20 1116 02/07/20 1249 02/07/20 1348  BP: 104/86 106/84 124/65 122/66  Pulse:   63 95  Resp:   (!) 28   Temp:   97.9 F (36.6 C) 98 F (36.7 C)  TempSrc:   Oral Oral  SpO2:   95% 94%  Weight:      Height:        Intake/Output Summary (Last 24 hours) at 02/07/2020 1418 Last data filed at 02/07/2020 0819 Gross per 24 hour  Intake 480 ml  Output 1150 ml  Net -670 ml   Filed Weights   02/05/20 1355 02/06/20 0424 02/07/20 0300  Weight: 92.7 kg 93 kg 91.9 kg    Examination:   Constitutional: NAD, AAOx3 HEENT: conjunctivae  and lids normal, EOMI CV: Irregular, tachycardic, No cyanosis.   RESP: crackles over posterior bases, on heated hf Extremities: No effusions, edema in BLE SKIN: warm, dry and intact Neuro: II - XII grossly intact.   Psych: Normal mood and affect.  Appropriate judgement and reason    Data Reviewed: I have personally reviewed following labs and imaging studies  CBC: Recent Labs  Lab 01/29/2020 1821 02/06/20 0426 02/07/20 0406  WBC 6.2 10.9* 15.2*  NEUTROABS 5.1  --   --   HGB 15.1 15.9 15.9  HCT 43.8 46.7 47.1  MCV 92.6 93.2 94.4  PLT 188 274 595   Basic Metabolic Panel: Recent Labs  Lab 01/16/2020 1821  02/05/20 1417 02/06/20 0426 02/07/20 0406  NA 129*  --  136 137  K 3.4*  --  3.6 4.1  CL 90*  --  99 98  CO2 26  --  23 26  GLUCOSE 123*  --  196* 203*  BUN 20  --  33* 37*  CREATININE 1.34*  --  1.18 1.32*  CALCIUM 8.6*  --  8.6* 8.6*  MG  --  2.4 2.6* 3.0*   GFR: Estimated Creatinine Clearance: 66.4 mL/min (A) (by C-G formula based on SCr of 1.32 mg/dL (H)). Liver Function Tests: Recent Labs  Lab 01/23/2020 1821  AST 56*  ALT 20  ALKPHOS 50  BILITOT 0.6  PROT 8.5*  ALBUMIN 3.7   No results for input(s): LIPASE, AMYLASE in the last 168 hours. No results for input(s): AMMONIA in the last 168 hours. Coagulation Profile: No results for input(s): INR, PROTIME in the last 168 hours. Cardiac Enzymes: No results for input(s): CKTOTAL, CKMB, CKMBINDEX, TROPONINI in the last 168 hours. BNP (last 3 results) No results for input(s): PROBNP in the last 8760 hours. HbA1C: No results for input(s): HGBA1C in the last 72 hours. CBG: No results for input(s): GLUCAP in the last 168 hours. Lipid Profile: No results for input(s): CHOL, HDL, LDLCALC, TRIG, CHOLHDL, LDLDIRECT in the last 72 hours. Thyroid Function Tests: No results for input(s): TSH, T4TOTAL, FREET4, T3FREE, THYROIDAB in the last 72 hours. Anemia Panel: Recent Labs    02/05/20 0049  FERRITIN 908*   Sepsis Labs: Recent Labs  Lab 02/03/2020 2100  PROCALCITON 0.20    Recent Results (from the past 240 hour(s))  Resp Panel by RT-PCR (Flu A&B, Covid) Nasopharyngeal Swab     Status: Abnormal   Collection Time: 01/30/2020  8:59 PM   Specimen: Nasopharyngeal Swab; Nasopharyngeal(NP) swabs in vial transport medium  Result Value Ref Range Status   SARS Coronavirus 2 by RT PCR POSITIVE (A) NEGATIVE Final    Comment: RESULT CALLED TO, READ BACK BY AND VERIFIED WITH: MCKENZIE WALLACE @2333  ON 01/23/2020 SK[facility] (NOTE) SARS-CoV-2 target nucleic acids are DETECTED.  The SARS-CoV-2 RNA is generally detectable in upper  respiratory specimens during the acute phase of infection. Positive results are indicative of the presence of the identified virus, but do not rule out bacterial infection or co-infection with other pathogens not detected by the test. Clinical correlation with patient history and other diagnostic information is necessary to determine patient infection status. The expected result is Negative.  Fact Sheet for Patients: EntrepreneurPulse.com.au  Fact Sheet for Healthcare Providers: IncredibleEmployment.be  This test is not yet approved or cleared by the [place] FDA and  has been authorized for detection and/or diagnosis of SARS-CoV-2 by FDA under an Emergency Use Authorization (EUA).  This EUA will remain in effect (meaning  this test c an be used) for the duration of  the COVID-19 declaration under Section 564(b)(1) of the Act, 21 U.S.C. section 360bbb-3(b)(1), unless the authorization is terminated or revoked sooner.     Influenza A by PCR NEGATIVE NEGATIVE Final   Influenza B by PCR NEGATIVE NEGATIVE Final    Comment: (NOTE) The Xpert Xpress SARS-CoV-2/FLU/RSV plus assay is intended as an aid in the diagnosis of influenza from Nasopharyngeal swab specimens and should not be used as a sole basis for treatment. Nasal washings and aspirates are unacceptable for Xpert Xpress SARS-CoV-2/FLU/RSV testing.  Fact Sheet for Patients: EntrepreneurPulse.com.au  Fact Sheet for Healthcare Providers: IncredibleEmployment.be  This test is not yet approved or cleared by the [place] FDA and has been authorized for detection and/or diagnosis of SARS-CoV-2 by FDA under an Emergency Use Authorization (EUA). This EUA will remain in effect (meaning this test can be used) for the duration of the COVID-19 declaration under Section 564(b)(1) of the Act, 21 U.S.C. section 360bbb-3(b)(1), unless the authorization is  terminated or revoked.  Performed at [facility], [address] [place],  [address]       Radiology Studies: No results found.   Scheduled Meds: . apixaban  5 mg Oral BID  . vitamin C  500 mg Oral Daily  . aspirin EC  81 mg Oral Daily  . baricitinib  4 mg Oral Daily  . cholecalciferol  1,000 Units Oral Daily  . famotidine  20 mg Oral BID  . feeding supplement  237 mL Oral BID BM  . FLUoxetine  40 mg Oral QHS  . gabapentin  300 mg Oral QHS  . guaiFENesin  600 mg Oral BID  . hydrOXYzine  25 mg Oral BID  . Ipratropium-Albuterol  1 puff Inhalation QID  . methylPREDNISolone (SOLU-MEDROL) injection  1 mg/kg Intravenous Q8H  . multivitamin with minerals  1 tablet Oral Daily  . zinc sulfate  220 mg Oral Daily   Continuous Infusions: . amiodarone 30 mg/hr (02/07/20 1257)  . diltiazem (CARDIZEM) infusion Stopped (02/05/20 1730)  . remdesivir 100 mg in NS 100 mL 100 mg (02/07/20 0846)     LOS: 3 days     Enzo Bi, MD [facility] If 7PM-7AM, please contact night-coverage 02/07/2020, 2:18 PM

## 2020-02-07 NOTE — Consult Note (Signed)
Cardiology Consultation Note    Patient ID: Alfred Clements, MRN: [id number], DOB/AGE: 09/14/1960 59 y.o. Admit date: 01/15/2020   Date of Consult: 02/07/2020 Primary Physician: Baxter Hire, MD Primary Cardiologist: None  Chief Complaint: sob Reason for Consultation: afib Requesting MD: Dr. Billie Ruddy  HPI: Alfred Clements is a 59 y.o. male with history of hypertension who presented to the emergency room with shortness of breath cough and wheezing.  He was noted to have fever chills with loss of taste and smell.  He had exposure to COVID-19 positive patient in his home.  He was not vaccinated.  He was COVID-19 PCR positive and influenza negative.  Chest x-ray revealed atypical pneumonia.  EKG showed sinus rhythm with a rate of 95 PACs.  He was treated with remdesivir and Solu-Medrol.  He was continued with baricitinib.  He developed atrial fibrillation with rapid ventricular response.  He was placed on amiodarone IV as well as IV diltiazem.  Started on apixaban 5 mg twice daily.  His CHA2DS2-VASc score is 0.  He is less than 22 now hypertension or diabetes.   Still complaining of cough and shortness of breath.  Denies chest pain.  Past Medical History:  Diagnosis Date  . Depression   . Hx of colonic polyps   . Hypertension       Surgical History:  Past Surgical History:  Procedure Laterality Date  . COLONOSCOPY WITH PROPOFOL N/A 07/30/2019   Procedure: COLONOSCOPY WITH PROPOFOL;  Surgeon: Robert Bellow, MD;  Location: [facility] ENDOSCOPY;  Service: Endoscopy;  Laterality: N/A;     Home Meds: Prior to Admission medications   Medication Sig Start Date End Date Taking? Authorizing Provider  FLUoxetine (PROZAC) 40 MG capsule Take 40 mg by mouth at bedtime. 12/26/19  Yes [provider]  gabapentin (NEURONTIN) 300 MG capsule Take 300 mg by mouth at bedtime.   Yes [provider]  hydrOXYzine (VISTARIL) 25 MG capsule Take 25 mg by mouth 2 (two) times daily. 12/26/19  Yes  [provider]  Multiple Vitamin (MULTIVITAMIN) tablet Take 1 tablet by mouth daily.   Yes [provider]  naproxen (NAPROSYN) 500 MG tablet Take 1 tablet (500 mg total) by mouth 2 (two) times daily with a meal. 01/11/16  Yes Lavonia Drafts, MD    Inpatient Medications:  . apixaban  5 mg Oral BID  . vitamin C  500 mg Oral Daily  . aspirin EC  81 mg Oral Daily  . baricitinib  4 mg Oral Daily  . cholecalciferol  1,000 Units Oral Daily  . famotidine  20 mg Oral BID  . feeding supplement  237 mL Oral BID BM  . FLUoxetine  40 mg Oral QHS  . gabapentin  300 mg Oral QHS  . guaiFENesin  600 mg Oral BID  . hydrOXYzine  25 mg Oral BID  . Ipratropium-Albuterol  1 puff Inhalation QID  . methylPREDNISolone (SOLU-MEDROL) injection  1 mg/kg Intravenous Q8H  . multivitamin with minerals  1 tablet Oral Daily  . zinc sulfate  220 mg Oral Daily   . amiodarone 30 mg/hr (02/07/20 0527)  . diltiazem (CARDIZEM) infusion Stopped (02/05/20 1730)  . remdesivir 100 mg in NS 100 mL 100 mg (02/07/20 0846)    Allergies:  Allergies  Allergen Reactions  . Penicillins Hives    Social History   Socioeconomic History  . Marital status: Married    Spouse name: Not on file  . Number of children: Not on file  .  Years of education: Not on file  . Highest education level: Not on file  Occupational History  . Not on file  Tobacco Use  . Smoking status: Never Smoker  . Smokeless tobacco: Never Used  Substance and Sexual Activity  . Alcohol use: Never  . Drug use: Never  . Sexual activity: Not on file  Other Topics Concern  . Not on file  Social History Narrative  . Not on file   Social Determinants of Health   Financial Resource Strain:   . Difficulty of Paying Living Expenses: Not on file  Food Insecurity:   . Worried About Charity fundraiser in the Last Year: Not on file  . Ran Out of Food in the Last Year: Not on file  Transportation Needs:   . Lack of Transportation  (Medical): Not on file  . Lack of Transportation (Non-Medical): Not on file  Physical Activity:   . Days of Exercise per Week: Not on file  . Minutes of Exercise per Session: Not on file  Stress:   . Feeling of Stress : Not on file  Social Connections:   . Frequency of Communication with Friends and Family: Not on file  . Frequency of Social Gatherings with Friends and Family: Not on file  . Attends Religious Services: Not on file  . Active Member of Clubs or Organizations: Not on file  . Attends Archivist Meetings: Not on file  . Marital Status: Not on file  Intimate Partner Violence:   . Fear of Current or Ex-Partner: Not on file  . Emotionally Abused: Not on file  . Physically Abused: Not on file  . Sexually Abused: Not on file     No family history on file.   Review of Systems: A 12-system review of systems was performed and is negative except as noted in the HPI.  Labs: No results for input(s): CKTOTAL, CKMB, TROPONINI in the last 72 hours. Lab Results  Component Value Date   WBC 15.2 (H) 02/07/2020   HGB 15.9 02/07/2020   HCT 47.1 02/07/2020   MCV 94.4 02/07/2020   PLT 357 02/07/2020    Recent Labs  Lab 01/29/2020 1821 02/06/20 0426 02/07/20 0406  NA 129*   < > 137  K 3.4*   < > 4.1  CL 90*   < > 98  CO2 26   < > 26  BUN 20   < > 37*  CREATININE 1.34*   < > 1.32*  CALCIUM 8.6*   < > 8.6*  PROT 8.5*  --   --   BILITOT 0.6  --   --   ALKPHOS 50  --   --   ALT 20  --   --   AST 56*  --   --   GLUCOSE 123*   < > 203*   < > = values in this interval not displayed.   No results found for: CHOL, HDL, LDLCALC, TRIG No results found for: DDIMER  Radiology/Studies:  DG Chest 2 View  Result Date: 01/30/2020 CLINICAL DATA:  59 year old male with shortness of breath. EXAM: CHEST - 2 VIEW COMPARISON:  None FINDINGS: Bilateral streaky pulmonary densities may represent edema but concerning for atypical infection. Clinical correlation is recommended. No  focal consolidation, pleural effusion, pneumothorax. The cardiac silhouette is within limits. No acute osseous pathology. IMPRESSION: Findings may represent edema but concerning for atypical infection. Clinical correlation is recommended. Electronically Signed   By: Laren Everts.D.  On: 02/09/2020 19:26    Wt Readings from Last 3 Encounters:  02/07/20 91.9 kg  07/30/19 93.4 kg  01/11/16 99.3 kg    EKG: Atrial fibrillation with rapid ventricular response  Physical Exam:  Blood pressure 104/86, pulse 77, temperature 97.9 F (36.6 C), temperature source Oral, resp. rate (!) 26, height 5' 9.5" (1.765 m), weight 91.9 kg, SpO2 97 %. Body mass index is 29.5 kg/m. General: Well developed, well nourished, in no acute distress. Head: Normocephalic, atraumatic, sclera non-icteric, no xanthomas, nares are without discharge.  Neck: Negative for carotid bruits. JVD not elevated. Lungs: Scattered bilateral rhonchi with expiratory wheezing. Heart: Irregular rhythm and tachycardic Abdomen: Soft, non-tender, non-distended with normoactive bowel sounds. No hepatomegaly. No rebound/guarding. No obvious abdominal masses. Msk:  Strength and tone appear normal for age. Extremities: No clubbing or cyanosis. No edema.  Distal pedal pulses are 2+ and equal bilaterally. Neuro: Alert and oriented X 3. No facial asymmetry. No focal deficit. Moves all extremities spontaneously. Psych:  Responds to questions appropriately with a normal affect.     Assessment and Plan   59 y.o. male with history of hypertension who presented to the emergency room with shortness of breath cough and wheezing.  He was noted to have fever chills with loss of taste and smell.  He had exposure to COVID-19 positive patient in his home.  He was not vaccinated.  He was COVID-19 PCR positive and influenza negative.  Chest x-ray revealed atypical pneumonia.  EKG showed sinus rhythm with a rate of 95 PACs.  He was treated with remdesivir  and Solu-Medrol.  He was continued with baricitinib.  He developed atrial fibrillation with rapid ventricular response.  He was placed on amiodarone IV as well as IV diltiazem.  Started on apixaban 5 mg twice daily.  His CHA2DS2-VASc score is 0.  He is less than 77 now hypertension or diabetes.   1.  COVID-19-currently on aggressive therapy with remdesivir, baricitinib and Solu-Medrol.  Still requiring high flow oxygen  2.  Atrial fibrillation-developed A. fib after presentation.  Unclear whether he had a preceding history of this.  Currently on amiodarone at maintenance IV at 30 mg at present.  Rate is still not well controlled.  Blood pressure precludes IV diltiazem.  We will try another bolus of IV amiodarone to see if this will improve.  Consideration for IV digoxin as needed for rate control given pressure also may need to be considered.  Would defer cardioversion and let us know other options due to comorbidities.  Continue with Eliquis.  Signed, Teodoro Spray MD 02/07/2020, 9:42 AM Pager: [phone]

## 2020-02-08 DIAGNOSIS — U071 COVID-19: Secondary | ICD-10-CM | POA: Diagnosis not present

## 2020-02-08 DIAGNOSIS — J1282 Pneumonia due to coronavirus disease 2019: Secondary | ICD-10-CM | POA: Diagnosis not present

## 2020-02-08 LAB — CBC
HCT: 47.1 % (ref 39.0–52.0)
Hemoglobin: 16 g/dL (ref 13.0–17.0)
MCH: 32.2 pg (ref 26.0–34.0)
MCHC: 34 g/dL (ref 30.0–36.0)
MCV: 94.8 fL (ref 80.0–100.0)
Platelets: 409 10*3/uL — ABNORMAL HIGH (ref 150–400)
RBC: 4.97 MIL/uL (ref 4.22–5.81)
RDW: 12.9 % (ref 11.5–15.5)
WBC: 15.3 10*3/uL — ABNORMAL HIGH (ref 4.0–10.5)
nRBC: 0 % (ref 0.0–0.2)

## 2020-02-08 LAB — BASIC METABOLIC PANEL
Anion gap: 10 (ref 5–15)
BUN: 34 mg/dL — ABNORMAL HIGH (ref 8–23)
CO2: 27 mmol/L (ref 22–32)
Calcium: 8.6 mg/dL — ABNORMAL LOW (ref 8.9–10.3)
Chloride: 100 mmol/L (ref 98–111)
Creatinine, Ser: 1.14 mg/dL (ref 0.61–1.24)
GFR, Estimated: >60 mL/min (ref >60)
Glucose, Bld: 210 mg/dL — ABNORMAL HIGH (ref 70–99)
Potassium: 3.9 mmol/L (ref 3.5–5.1)
Sodium: 137 mmol/L (ref 135–145)

## 2020-02-08 LAB — C-REACTIVE PROTEIN: CRP: 5.8 mg/dL — ABNORMAL HIGH (ref <1.0)

## 2020-02-08 LAB — HEMOGLOBIN A1C
Hgb A1c MFr Bld: 6.5 % — ABNORMAL HIGH (ref 4.8–5.6)
Mean Plasma Glucose: 139.85 mg/dL

## 2020-02-08 LAB — GLUCOSE, CAPILLARY
Glucose-Capillary: 232 mg/dL — ABNORMAL HIGH (ref 70–99)
Glucose-Capillary: 174 mg/dL — ABNORMAL HIGH (ref 70–99)

## 2020-02-08 LAB — MAGNESIUM: Magnesium: 3 mg/dL — ABNORMAL HIGH (ref 1.7–2.4)

## 2020-02-08 MED ORDER — DILTIAZEM HCL 30 MG PO TABS
30.0000 mg | ORAL_TABLET | Freq: Four times a day (QID) | ORAL | Status: DC
  Administered 2020-02-08 – 2020-02-09 (×3): 30 mg via ORAL
  Filled 2020-02-08 (×4): qty 1

## 2020-02-08 MED ORDER — INSULIN ASPART 100 UNIT/ML ~~LOC~~ SOLN
0.0000 [IU] | Freq: Every day | SUBCUTANEOUS | Status: DC
  Administered 2020-02-10 – 2020-02-11 (×2): 2 [IU] via SUBCUTANEOUS
  Filled 2020-02-08 (×3): qty 1

## 2020-02-08 MED ORDER — INSULIN ASPART 100 UNIT/ML ~~LOC~~ SOLN
0.0000 [IU] | Freq: Three times a day (TID) | SUBCUTANEOUS | Status: DC
  Administered 2020-02-08: 5 [IU] via SUBCUTANEOUS
  Administered 2020-02-09: 3 [IU] via SUBCUTANEOUS
  Administered 2020-02-09 – 2020-02-10 (×3): 5 [IU] via SUBCUTANEOUS
  Administered 2020-02-10: 3 [IU] via SUBCUTANEOUS
  Administered 2020-02-10 – 2020-02-11 (×2): 5 [IU] via SUBCUTANEOUS
  Administered 2020-02-11: 3 [IU] via SUBCUTANEOUS
  Administered 2020-02-11: 8 [IU] via SUBCUTANEOUS
  Administered 2020-02-12 (×2): 5 [IU] via SUBCUTANEOUS
  Administered 2020-02-12 – 2020-02-13 (×2): 3 [IU] via SUBCUTANEOUS
  Administered 2020-02-13: 2 [IU] via SUBCUTANEOUS
  Administered 2020-02-13: 8 [IU] via SUBCUTANEOUS
  Administered 2020-02-14 (×3): 3 [IU] via SUBCUTANEOUS
  Administered 2020-02-15: 2 [IU] via SUBCUTANEOUS
  Administered 2020-02-15: 5 [IU] via SUBCUTANEOUS
  Administered 2020-02-15 – 2020-02-17 (×4): 3 [IU] via SUBCUTANEOUS
  Administered 2020-02-17: 8 [IU] via SUBCUTANEOUS
  Administered 2020-02-18: 2 [IU] via SUBCUTANEOUS
  Administered 2020-02-18: 3 [IU] via SUBCUTANEOUS
  Administered 2020-02-19 (×2): 2 [IU] via SUBCUTANEOUS
  Administered 2020-02-20: 3 [IU] via SUBCUTANEOUS
  Administered 2020-02-20: 5 [IU] via SUBCUTANEOUS
  Administered 2020-02-20: 3 [IU] via SUBCUTANEOUS
  Administered 2020-02-21: 13:00:00 11 [IU] via SUBCUTANEOUS
  Administered 2020-02-21: 10:00:00 8 [IU] via SUBCUTANEOUS
  Administered 2020-02-21: 18:00:00 5 [IU] via SUBCUTANEOUS
  Filled 2020-02-08 (×35): qty 1

## 2020-02-08 NOTE — Progress Notes (Signed)
Pts O2 requirements are increasing  Currently on 55L 80% with sats 90% Worked with IS with the pt, achieved about 700cc

## 2020-02-08 NOTE — Progress Notes (Signed)
Spoke with Dr. Ubaldo Glassing regarding patients heart rate. Currently 120-130. Vss. Per dr. Ubaldo Glassing start on Cardizem 30mg  po q6h. If patients heart rate continue to run high, let him know. Will continue to monitor closely

## 2020-02-08 NOTE — Progress Notes (Signed)
Patients sat 77% on heated high flow 60%, 40L. Upon entering the room, patient sitting up in the bed, stats he can not feel any air from the heated high flow. Patient was instructed to use incentive spirometer and NRB 15L was also placed on patient. Called respiratory to make aware, per stacy RT she will come and assess patient and will increased heated high flow. Current sat 91%. Will continue to monitor closely

## 2020-02-08 NOTE — Progress Notes (Signed)
Made aware by CCMD, patient had 5 beat run VTach. Made Dr. Ubaldo Glassing and Dr. Billie Ruddy aware. Will continue to monitor

## 2020-02-08 NOTE — Progress Notes (Signed)
Dr. Ubaldo Glassing returned the page, started if patient continues to have recurrent runs of Vtach, make him aware. Will continue to monitor closely

## 2020-02-08 NOTE — Progress Notes (Signed)
PROGRESS NOTE    Alfred Clements  MPN:[id number] DOB: 03-Jun-1960 DOA: 02/06/2020 PCP: Baxter Hire, MD    Assessment & Plan:   Active Problems:   Atypical pneumonia    Alfred Clements  is a 59 y.o. Hispanic male with a known history of depression and hypertension, who presented to the emergency room with acute onset of dyspnea and fever as well as dry cough and wheezing since Monday.  He admits to fever and chills as well as loss of taste and smell.  He has been having fatigue and tiredness.  He admitted to diarrhea with bright red bleeding per rectum or melena.  No nausea or vomiting.  He has been exposed to his son who was diagnosed with COVID-19 a couple days ago.  2 daughters also have similar symptoms.  The patient was noted to have hypoxemia with pulse oximetry has been ranging 88 to 89% on room air.  The patient has not been vaccinated for COVID-19.  1. Acute hypoxemic respiratory failure secondary to COVID-19. --documented O2 sat 86% on room air, was put on 3L.  O2 requirement has since increased to needing heated hf. --Continue supplemental O2 to keep sats >88%, wean as tolerated  2. Multifocal pneumonia secondary to COVID-19. --CXR showed "Bilateral streaky pulmonary densities".  BNP 40. --CRP 25.1 on presentation, started on solumedrol, trending down PLAN: --cont Remdesivir --cont solumedrol q8h --trend CRP --cont Baricitinib  --cont Combivent QID  # Afib w RVR --appeared to have developed after presentation. --s/p IV dilt 10 mg f/b dilt gtt@ 15 in the ED, but still not rate controlled.  Started on amiodarone bolus f/b gtt, repeated bolus once. --still not rate controlled today.  BP intermittently soft. --cardiology consulted PLAN: --cont amiodarone gtt --start oral dit 30 mg q6h (monitor BP) --cont Eliquis in case pt needs cardioversion  3.  Hypokalemia. --monitor and replete PRN  4.  acute kidney injury, ruled out --Cr 1.34 on presentation.  Baseline  appeared to be around 1.1.  Does not meet criteria for AKI  Hyponatremia --was started on MIVF on admission, since d/c'ed. --encourage oral hydration.  5.  Hx of Essential hypertension, not currently active --not on home BP meds. --BP currently on the low side. --start oral dit 30 mg q6h for Afib RVR (monitor BP)   6.  Depression. Continue Prozac.  GERD --cont Pepcid  Hyperglycemia --no prior hx of DM.   --check A1c --start SSI ACHS   DVT prophylaxis: Lovenox SQ Code Status: Full code  Family Communication:  Status is: inpatient Dispo:   The patient is from: home Anticipated d/c is to: home Anticipated d/c date is: > 3 days Patient currently is not medically stable to d/c due to: covid on heated hf   Subjective and Interval History:  Pt reported feeling much better today.  O2 requirement stable.  HR still in 120's.   Objective: Vitals:   02/08/20 1305 02/08/20 1306 02/08/20 1307 02/08/20 1332  BP:      Pulse: (!) 150 65 (!) 56 75  Resp: (!) 31 (!) 24 (!) 30 (!) 23  Temp:      TempSrc:      SpO2: 92% 93% 93% 92%  Weight:      Height:        Intake/Output Summary (Last 24 hours) at 02/08/2020 1641 Last data filed at 02/08/2020 1550 Gross per 24 hour  Intake 1678.17 ml  Output 800 ml  Net 878.17 ml   [employer]  02/06/20 0424 02/07/20 0300 02/08/20 0436  Weight: 93 kg 91.9 kg 91.7 kg    Examination:   Constitutional: NAD, AAOx3 HEENT: conjunctivae and lids normal, EOMI CV: irregular, tachycardic, No cyanosis.   RESP: no distress, on heated hf Extremities: No effusions, edema in BLE SKIN: warm, dry and intact Neuro: II - XII grossly intact.   Psych: Normal mood and affect.  Appropriate judgement and reason    Data Reviewed: I have personally reviewed following labs and imaging studies  CBC: Recent Labs  Lab 01/16/2020 1821 02/06/20 0426 02/07/20 0406 02/08/20 0544  WBC 6.2 10.9* 15.2* 15.3*  NEUTROABS 5.1  --   --   --   HGB 15.1  15.9 15.9 16.0  HCT 43.8 46.7 47.1 47.1  MCV 92.6 93.2 94.4 94.8  PLT 188 274 357 497*   Basic Metabolic Panel: Recent Labs  Lab 01/15/2020 1821 02/05/20 1417 02/06/20 0426 02/07/20 0406 02/08/20 0544  NA 129*  --  136 137 137  K 3.4*  --  3.6 4.1 3.9  CL 90*  --  99 98 100  CO2 26  --  23 26 27   GLUCOSE 123*  --  196* 203* 210*  BUN 20  --  33* 37* 34*  CREATININE 1.34*  --  1.18 1.32* 1.14  CALCIUM 8.6*  --  8.6* 8.6* 8.6*  MG  --  2.4 2.6* 3.0* 3.0*   GFR: Estimated Creatinine Clearance: 76.8 mL/min (by C-G formula based on SCr of 1.14 mg/dL). Liver Function Tests: Recent Labs  Lab 01/12/2020 1821  AST 56*  ALT 20  ALKPHOS 50  BILITOT 0.6  PROT 8.5*  ALBUMIN 3.7   No results for input(s): LIPASE, AMYLASE in the last 168 hours. No results for input(s): AMMONIA in the last 168 hours. Coagulation Profile: No results for input(s): INR, PROTIME in the last 168 hours. Cardiac Enzymes: No results for input(s): CKTOTAL, CKMB, CKMBINDEX, TROPONINI in the last 168 hours. BNP (last 3 results) No results for input(s): PROBNP in the last 8760 hours. HbA1C: No results for input(s): HGBA1C in the last 72 hours. CBG: No results for input(s): GLUCAP in the last 168 hours. Lipid Profile: No results for input(s): CHOL, HDL, LDLCALC, TRIG, CHOLHDL, LDLDIRECT in the last 72 hours. Thyroid Function Tests: No results for input(s): TSH, T4TOTAL, FREET4, T3FREE, THYROIDAB in the last 72 hours. Anemia Panel: No results for input(s): VITAMINB12, FOLATE, FERRITIN, TIBC, IRON, RETICCTPCT in the last 72 hours. Sepsis Labs: Recent Labs  Lab 02/07/2020 2100  PROCALCITON 0.20    Recent Results (from the past 240 hour(s))  Resp Panel by RT-PCR (Flu A&B, Covid) Nasopharyngeal Swab     Status: Abnormal   Collection Time: 01/26/2020  8:59 PM   Specimen: Nasopharyngeal Swab; Nasopharyngeal(NP) swabs in vial transport medium  Result Value Ref Range Status   SARS Coronavirus 2 by RT PCR POSITIVE  (A) NEGATIVE Final    Comment: RESULT CALLED TO, READ BACK BY AND VERIFIED WITH: MCKENZIE WALLACE @2333  ON 01/26/2020 SKL (NOTE) SARS-CoV-2 target nucleic acids are DETECTED.  The SARS-CoV-2 RNA is generally detectable in upper respiratory specimens during the acute phase of infection. Positive results are indicative of the presence of the identified virus, but do not rule out bacterial infection or co-infection with other pathogens not detected by the test. Clinical correlation with patient history and other diagnostic information is necessary to determine patient infection status. The expected result is Negative.  Fact Sheet for Patients: EntrepreneurPulse.com.au  Fact Sheet  for Healthcare Providers: IncredibleEmployment.be  This test is not yet approved or cleared by the [place] and  has been authorized for detection and/or diagnosis of SARS-CoV-2 by FDA under an Emergency Use Authorization (EUA).  This EUA will remain in effect (meaning this test c an be used) for the duration of  the COVID-19 declaration under Section 564(b)(1) of the Act, 21 U.S.C. section 360bbb-3(b)(1), unless the authorization is terminated or revoked sooner.     Influenza A by PCR NEGATIVE NEGATIVE Final   Influenza B by PCR NEGATIVE NEGATIVE Final    Comment: (NOTE) The Xpert Xpress SARS-CoV-2/FLU/RSV plus assay is intended as an aid in the diagnosis of influenza from Nasopharyngeal swab specimens and should not be used as a sole basis for treatment. Nasal washings and aspirates are unacceptable for Xpert Xpress SARS-CoV-2/FLU/RSV testing.  Fact Sheet for Patients: EntrepreneurPulse.com.au  Fact Sheet for Healthcare Providers: IncredibleEmployment.be  This test is not yet approved or cleared by the [place] FDA and has been authorized for detection and/or diagnosis of SARS-CoV-2 by FDA under an Emergency Use  Authorization (EUA). This EUA will remain in effect (meaning this test can be used) for the duration of the COVID-19 declaration under Section 564(b)(1) of the Act, 21 U.S.C. section 360bbb-3(b)(1), unless the authorization is terminated or revoked.  Performed at [facility], [address], [place],  [address]       Radiology Studies: No results found.   Scheduled Meds: . apixaban  5 mg Oral BID  . vitamin C  500 mg Oral Daily  . aspirin EC  81 mg Oral Daily  . baricitinib  4 mg Oral Daily  . cholecalciferol  1,000 Units Oral Daily  . diltiazem  30 mg Oral Q6H  . famotidine  20 mg Oral BID  . feeding supplement  237 mL Oral BID BM  . FLUoxetine  40 mg Oral QHS  . gabapentin  300 mg Oral QHS  . guaiFENesin  600 mg Oral BID  . hydrOXYzine  25 mg Oral BID  . insulin aspart  0-15 Units Subcutaneous TID WC  . insulin aspart  0-5 Units Subcutaneous QHS  . Ipratropium-Albuterol  1 puff Inhalation QID  . methylPREDNISolone (SOLU-MEDROL) injection  1 mg/kg Intravenous Q8H  . multivitamin with minerals  1 tablet Oral Daily  . zinc sulfate  220 mg Oral Daily   Continuous Infusions: . amiodarone 30 mg/hr (02/08/20 1550)  . diltiazem (CARDIZEM) infusion Stopped (02/05/20 1730)  . remdesivir 100 mg in NS 100 mL Stopped (02/08/20 0820)     LOS: 4 days     Enzo Bi, MD [facility] If 7PM-7AM, please contact night-coverage 02/08/2020, 4:41 PM

## 2020-02-08 NOTE — Progress Notes (Signed)
Patient Name: Alfred Clements Date of Encounter: 02/08/2020  Hospital Problem List     Active Problems:   Atypical pneumonia    Patient Profile     59 y.o. male with history of hypertension who presented to the emergency room with shortness of breath cough and wheezing.  He was noted to have fever chills with loss of taste and smell.  He had exposure to COVID-19 positive patient in his home.  He was not vaccinated.  He was COVID-19 PCR positive and influenza negative.  Chest x-ray revealed atypical pneumonia.  EKG showed sinus rhythm with a rate of 95 PACs.  He was treated with remdesivir and Solu-Medrol.  He was continued with baricitinib.  He developed atrial fibrillation with rapid ventricular response.  He was placed on amiodarone IV as well as IV diltiazem.  Started on apixaban 5 mg twice daily.  His CHA2DS2-VASc score is 0.  He is less than 55 now hypertension or diabetes.  Subjective     Inpatient Medications    . apixaban  5 mg Oral BID  . vitamin C  500 mg Oral Daily  . aspirin EC  81 mg Oral Daily  . baricitinib  4 mg Oral Daily  . cholecalciferol  1,000 Units Oral Daily  . diltiazem  30 mg Oral Q6H  . famotidine  20 mg Oral BID  . feeding supplement  237 mL Oral BID BM  . FLUoxetine  40 mg Oral QHS  . gabapentin  300 mg Oral QHS  . guaiFENesin  600 mg Oral BID  . hydrOXYzine  25 mg Oral BID  . Ipratropium-Albuterol  1 puff Inhalation QID  . methylPREDNISolone (SOLU-MEDROL) injection  1 mg/kg Intravenous Q8H  . multivitamin with minerals  1 tablet Oral Daily  . zinc sulfate  220 mg Oral Daily    Vital Signs    Vitals:   02/08/20 0740 02/08/20 0745 02/08/20 0750 02/08/20 0755  BP:  (!) 130/95    Pulse: 78 60 (!) 44 (!) 33  Resp: (!) 30 (!) 26 (!) 33 (!) 25  Temp:  97.8 F (36.6 C)    TempSrc:  Oral    SpO2: 90% 92% 94% 94%  Weight:      Height:        Intake/Output Summary (Last 24 hours) at 02/08/2020 1139 Last data filed at 02/08/2020 1014 Gross  per 24 hour  Intake 1236.2 ml  Output 1050 ml  Net 186.2 ml   Filed Weights   02/06/20 0424 02/07/20 0300 02/08/20 0436  Weight: 93 kg 91.9 kg 91.7 kg    Physical Exam      Labs    CBC Recent Labs    02/07/20 0406 02/08/20 0544  WBC 15.2* 15.3*  HGB 15.9 16.0  HCT 47.1 47.1  MCV 94.4 94.8  PLT 357 812*   Basic Metabolic Panel Recent Labs    02/07/20 0406 02/08/20 0544  NA 137 137  K 4.1 3.9  CL 98 100  CO2 26 27  GLUCOSE 203* 210*  BUN 37* 34*  CREATININE 1.32* 1.14  CALCIUM 8.6* 8.6*  MG 3.0* 3.0*   Liver Function Tests No results for input(s): AST, ALT, ALKPHOS, BILITOT, PROT, ALBUMIN in the last 72 hours. No results for input(s): LIPASE, AMYLASE in the last 72 hours. Cardiac Enzymes No results for input(s): CKTOTAL, CKMB, CKMBINDEX, TROPONINI in the last 72 hours. BNP No results for input(s): BNP in the last 72 hours. D-Dimer No results for  input(s): DDIMER in the last 72 hours. Hemoglobin A1C No results for input(s): HGBA1C in the last 72 hours. Fasting Lipid Panel No results for input(s): CHOL, HDL, LDLCALC, TRIG, CHOLHDL, LDLDIRECT in the last 72 hours. Thyroid Function Tests No results for input(s): TSH, T4TOTAL, T3FREE, THYROIDAB in the last 72 hours.  Invalid input(s): FREET3  Telemetry    Atrial fibrillation with variable but fairly rapid ventricular response  ECG    A. fib with RVR  Radiology    DG Chest 2 View  Result Date: 01/21/2020 CLINICAL DATA:  59 year old male with shortness of breath. EXAM: CHEST - 2 VIEW COMPARISON:  None FINDINGS: Bilateral streaky pulmonary densities may represent edema but concerning for atypical infection. Clinical correlation is recommended. No focal consolidation, pleural effusion, pneumothorax. The cardiac silhouette is within limits. No acute osseous pathology. IMPRESSION: Findings may represent edema but concerning for atypical infection. Clinical correlation is recommended. Electronically Signed    By: Anner Crete M.D.   On: 02/03/2020 19:26    Assessment & Plan    59 y.o. male with history of hypertension who presented to the emergency room with shortness of breath cough and wheezing.  He was noted to have fever chills with loss of taste and smell.  He had exposure to COVID-19 positive patient in his home.  He was not vaccinated.  He was COVID-19 PCR positive and influenza negative.  Chest x-ray revealed atypical pneumonia.  EKG showed sinus rhythm with a rate of 95 PACs.  He was treated with remdesivir and Solu-Medrol.  He was continued with baricitinib.  He developed atrial fibrillation with rapid ventricular response.  He was placed on amiodarone IV as well as IV diltiazem.  Started on apixaban 5 mg twice daily.  His CHA2DS2-VASc score is 0.  He is less than 74 now hypertension or diabetes.   1.  COVID-19-currently on aggressive therapy with remdesivir, baricitinib and Solu-Medrol.  Still requiring high flow oxygen  2.  Atrial fibrillation-developed A. fib after presentation.  Unclear whether he had a preceding history of this.  Currently on amiodarone at maintenance IV at 30 mg at present.  Rate is still not well controlled however improved transiently after a repeat bolus of amiodarone yesterday.  Hemodynamics have improved.  Will add low-dose p.o. Cardizem 30 every 6 to see if this will improve his rate.  Consideration for increasing the Cardizem or a repeat bolus of amiodarone will depend on his heart rate response. Would defer cardioversion and let us know other options due to comorbidities.  Continue with Eliquis.  Signed, Javier Docker Toney Difatta MD 02/08/2020, 11:39 AM  Pager: [phone]

## 2020-02-09 ENCOUNTER — Encounter: Payer: Self-pay | Admitting: Family Medicine

## 2020-02-09 DIAGNOSIS — J1282 Pneumonia due to coronavirus disease 2019: Secondary | ICD-10-CM | POA: Diagnosis not present

## 2020-02-09 DIAGNOSIS — I4891 Unspecified atrial fibrillation: Secondary | ICD-10-CM | POA: Diagnosis not present

## 2020-02-09 DIAGNOSIS — U071 COVID-19: Secondary | ICD-10-CM | POA: Diagnosis not present

## 2020-02-09 LAB — BASIC METABOLIC PANEL
Anion gap: 11 (ref 5–15)
BUN: 39 mg/dL — ABNORMAL HIGH (ref 8–23)
CO2: 27 mmol/L (ref 22–32)
Calcium: 8.5 mg/dL — ABNORMAL LOW (ref 8.9–10.3)
Chloride: 102 mmol/L (ref 98–111)
Creatinine, Ser: 1.23 mg/dL (ref 0.61–1.24)
GFR, Estimated: >60 mL/min (ref >60)
Glucose, Bld: 215 mg/dL — ABNORMAL HIGH (ref 70–99)
Potassium: 4.3 mmol/L (ref 3.5–5.1)
Sodium: 140 mmol/L (ref 135–145)

## 2020-02-09 LAB — CBC
HCT: 45.7 % (ref 39.0–52.0)
Hemoglobin: 15.1 g/dL (ref 13.0–17.0)
MCH: 31.9 pg (ref 26.0–34.0)
MCHC: 33 g/dL (ref 30.0–36.0)
MCV: 96.6 fL (ref 80.0–100.0)
Platelets: 442 10*3/uL — ABNORMAL HIGH (ref 150–400)
RBC: 4.73 MIL/uL (ref 4.22–5.81)
RDW: 12.9 % (ref 11.5–15.5)
WBC: 17.4 10*3/uL — ABNORMAL HIGH (ref 4.0–10.5)
nRBC: 0 % (ref 0.0–0.2)

## 2020-02-09 LAB — GLUCOSE, CAPILLARY
Glucose-Capillary: 218 mg/dL — ABNORMAL HIGH (ref 70–99)
Glucose-Capillary: 220 mg/dL — ABNORMAL HIGH (ref 70–99)
Glucose-Capillary: 182 mg/dL — ABNORMAL HIGH (ref 70–99)
Glucose-Capillary: 171 mg/dL — ABNORMAL HIGH (ref 70–99)

## 2020-02-09 LAB — MAGNESIUM: Magnesium: 3.1 mg/dL — ABNORMAL HIGH (ref 1.7–2.4)

## 2020-02-09 LAB — C-REACTIVE PROTEIN: CRP: 4.1 mg/dL — ABNORMAL HIGH (ref <1.0)

## 2020-02-09 MED ORDER — DILTIAZEM HCL 30 MG PO TABS
60.0000 mg | ORAL_TABLET | Freq: Four times a day (QID) | ORAL | Status: DC
  Administered 2020-02-09 – 2020-02-15 (×24): 60 mg via ORAL
  Filled 2020-02-09 (×24): qty 2

## 2020-02-09 NOTE — Progress Notes (Signed)
Patient has yellow MEWS.  He has had previous yellow for most of this admission d/t HR.  His medications have been adjusted and MD aware.  There is not a need to do more frequent vitals at this time.

## 2020-02-09 NOTE — Progress Notes (Signed)
PROGRESS NOTE    Alfred Clements  HBZ:[id number] DOB: 1960/05/23 DOA: 02/07/2020 PCP: Baxter Hire, MD    Assessment & Plan:   Active Problems:   Atypical pneumonia    Alfred Clements  is a 59 y.o. Hispanic male with a known history of depression and hypertension, who presented to the emergency room with acute onset of dyspnea and fever as well as dry cough and wheezing since Monday.  He admits to fever and chills as well as loss of taste and smell.  He has been having fatigue and tiredness.  He admitted to diarrhea with bright red bleeding per rectum or melena.  No nausea or vomiting.  He has been exposed to his son who was diagnosed with COVID-19 a couple days ago.  2 daughters also have similar symptoms.  The patient was noted to have hypoxemia with pulse oximetry has been ranging 88 to 89% on room air.  The patient has not been vaccinated for COVID-19.  1. Acute hypoxemic respiratory failure secondary to COVID-19. --documented O2 sat 86% on room air, was put on 3L.  O2 requirement has since increased to needing heated hf.  Unable to wean. --Continue supplemental O2 to keep sats >=88%, wean as tolerated  2. Multifocal pneumonia secondary to COVID-19. --CXR showed "Bilateral streaky pulmonary densities".  BNP 40. --CRP 25.1 on presentation, started on solumedrol, trending down PLAN: --cont Remdesivir --cont solumedrol q8h --ensure CRP trending down with enough steroid --cont Baricitinib  --cont Combivent QID  # Afib w RVR --appeared to have developed after presentation. --s/p IV dilt 10 mg f/b dilt gtt@ 15 in the ED, but still not rate controlled.  Started on amiodarone bolus f/b gtt, repeated bolus once. --still not rate controlled today.  BP intermittently soft. --cardiology consulted PLAN: --cont amiodarone gtt --increase oral dilt to 60 mg q6h, per cards (monitor BP before dosing) --cont Eliquis in case pt needs cardioversion  3.  Hypokalemia. --monitor and  replete PRN  4.  acute kidney injury, ruled out --Cr 1.34 on presentation.  Baseline appeared to be around 1.1.  Does not meet criteria for AKI  Hyponatremia --was started on MIVF on admission, since d/c'ed. --encourage oral hydration.  5.  Hx of Essential hypertension, not currently active --not on home BP meds. --BP currently on the low side. --increase oral dilt to 60 mg q6h, per cards (monitor BP before dosing)  6.  Depression. Continue Prozac.  GERD --cont Pepcid  Hyperglycemia --no prior hx of DM.   --A1c 6.5 --SSI ACHS for now   DVT prophylaxis: Lovenox SQ Code Status: Full code  Family Communication:  Status is: inpatient Dispo:   The patient is from: home Anticipated d/c is to: home Anticipated d/c date is: > 3 days Patient currently is not medically stable to d/c due to: covid on heated hf, afib w RVR uncontrolled   Subjective and Interval History:  Pt reported some nausea.  O2 requirement worsened.  Still in Afib RVR.    Objective: Vitals:   02/09/20 0600 02/09/20 0824 02/09/20 1200 02/09/20 1324  BP: 95/74 108/89 (!) 127/98   Pulse:  (!) 108 76   Resp:  20 (!) 25   Temp:  98.6 F (37 C) 98 F (36.7 C)   TempSrc:  Oral    SpO2: 96% 98% 99% 94%  Weight:      Height:        Intake/Output Summary (Last 24 hours) at 02/09/2020 1422 Last data filed at 02/09/2020 [phone]  Gross per 24 hour  Intake 180.26 ml  Output 1250 ml  Net -1069.74 ml   Filed Weights   02/07/20 0300 02/08/20 0436 02/09/20 0444  Weight: 91.9 kg 91.7 kg 91.7 kg    Examination:   Constitutional: NAD, AAOx3 HEENT: conjunctivae and lids normal, EOMI CV: irregular, tachycardic, No cyanosis.   RESP: reduced lung sound, on heated hf Extremities: No effusions, edema in BLE SKIN: warm, dry and intact Neuro: II - XII grossly intact.   Psych: depressed mood and affect.     Data Reviewed: I have personally reviewed following labs and imaging studies  CBC: Recent Labs   Lab 01/16/2020 1821 02/06/20 0426 02/07/20 0406 02/08/20 0544 02/09/20 0551  WBC 6.2 10.9* 15.2* 15.3* 17.4*  NEUTROABS 5.1  --   --   --   --   HGB 15.1 15.9 15.9 16.0 15.1  HCT 43.8 46.7 47.1 47.1 45.7  MCV 92.6 93.2 94.4 94.8 96.6  PLT 188 274 357 409* 720*   Basic Metabolic Panel: Recent Labs  Lab 02/09/2020 1821 02/05/20 1417 02/06/20 0426 02/07/20 0406 02/08/20 0544 02/09/20 0551  NA 129*  --  136 137 137 140  K 3.4*  --  3.6 4.1 3.9 4.3  CL 90*  --  99 98 100 102  CO2 26  --  23 26 27 27   GLUCOSE 123*  --  196* 203* 210* 215*  BUN 20  --  33* 37* 34* 39*  CREATININE 1.34*  --  1.18 1.32* 1.14 1.23  CALCIUM 8.6*  --  8.6* 8.6* 8.6* 8.5*  MG  --  2.4 2.6* 3.0* 3.0* 3.1*   GFR: Estimated Creatinine Clearance: 71.2 mL/min (by C-G formula based on SCr of 1.23 mg/dL). Liver Function Tests: Recent Labs  Lab 01/15/2020 1821  AST 56*  ALT 20  ALKPHOS 50  BILITOT 0.6  PROT 8.5*  ALBUMIN 3.7   No results for input(s): LIPASE, AMYLASE in the last 168 hours. No results for input(s): AMMONIA in the last 168 hours. Coagulation Profile: No results for input(s): INR, PROTIME in the last 168 hours. Cardiac Enzymes: No results for input(s): CKTOTAL, CKMB, CKMBINDEX, TROPONINI in the last 168 hours. BNP (last 3 results) No results for input(s): PROBNP in the last 8760 hours. HbA1C: Recent Labs    02/08/20 0544  HGBA1C 6.5*   CBG: Recent Labs  Lab 02/08/20 1740 02/08/20 2217 02/09/20 0826 02/09/20 1226  GLUCAP 232* 174* 218* 220*   Lipid Profile: No results for input(s): CHOL, HDL, LDLCALC, TRIG, CHOLHDL, LDLDIRECT in the last 72 hours. Thyroid Function Tests: No results for input(s): TSH, T4TOTAL, FREET4, T3FREE, THYROIDAB in the last 72 hours. Anemia Panel: No results for input(s): VITAMINB12, FOLATE, FERRITIN, TIBC, IRON, RETICCTPCT in the last 72 hours. Sepsis Labs: Recent Labs  Lab 02/03/2020 2100  PROCALCITON 0.20    Recent Results (from the past 240  hour(s))  Resp Panel by RT-PCR (Flu A&B, Covid) Nasopharyngeal Swab     Status: Abnormal   Collection Time: 01/16/2020  8:59 PM   Specimen: Nasopharyngeal Swab; Nasopharyngeal(NP) swabs in vial transport medium  Result Value Ref Range Status   SARS Coronavirus 2 by RT PCR POSITIVE (A) NEGATIVE Final    Comment: RESULT CALLED TO, READ BACK BY AND VERIFIED WITH: MCKENZIE WALLACE @2333  ON 01/21/2020 SKL (NOTE) SARS-CoV-2 target nucleic acids are DETECTED.  The SARS-CoV-2 RNA is generally detectable in upper respiratory specimens during the acute phase of infection. Positive results are indicative of the  presence of the identified virus, but do not rule out bacterial infection or co-infection with other pathogens not detected by the test. Clinical correlation with patient history and other diagnostic information is necessary to determine patient infection status. The expected result is Negative.  Fact Sheet for Patients: EntrepreneurPulse.com.au  Fact Sheet for Healthcare Providers: IncredibleEmployment.be  This test is not yet approved or cleared by the [place] FDA and  has been authorized for detection and/or diagnosis of SARS-CoV-2 by FDA under an Emergency Use Authorization (EUA).  This EUA will remain in effect (meaning this test c an be used) for the duration of  the COVID-19 declaration under Section 564(b)(1) of the Act, 21 U.S.C. section 360bbb-3(b)(1), unless the authorization is terminated or revoked sooner.     Influenza A by PCR NEGATIVE NEGATIVE Final   Influenza B by PCR NEGATIVE NEGATIVE Final    Comment: (NOTE) The Xpert Xpress SARS-CoV-2/FLU/RSV plus assay is intended as an aid in the diagnosis of influenza from Nasopharyngeal swab specimens and should not be used as a sole basis for treatment. Nasal washings and aspirates are unacceptable for Xpert Xpress SARS-CoV-2/FLU/RSV testing.  Fact Sheet for  Patients: EntrepreneurPulse.com.au  Fact Sheet for Healthcare Providers: IncredibleEmployment.be  This test is not yet approved or cleared by the [place] FDA and has been authorized for detection and/or diagnosis of SARS-CoV-2 by FDA under an Emergency Use Authorization (EUA). This EUA will remain in effect (meaning this test can be used) for the duration of the COVID-19 declaration under Section 564(b)(1) of the Act, 21 U.S.C. section 360bbb-3(b)(1), unless the authorization is terminated or revoked.  Performed at [facility], [address] [place], [place] [address]       Radiology Studies: No results found.   Scheduled Meds: . apixaban  5 mg Oral BID  . vitamin C  500 mg Oral Daily  . aspirin EC  81 mg Oral Daily  . baricitinib  4 mg Oral Daily  . cholecalciferol  1,000 Units Oral Daily  . diltiazem  60 mg Oral Q6H  . famotidine  20 mg Oral BID  . feeding supplement  237 mL Oral BID BM  . FLUoxetine  40 mg Oral QHS  . gabapentin  300 mg Oral QHS  . guaiFENesin  600 mg Oral BID  . hydrOXYzine  25 mg Oral BID  . insulin aspart  0-15 Units Subcutaneous TID WC  . insulin aspart  0-5 Units Subcutaneous QHS  . Ipratropium-Albuterol  1 puff Inhalation QID  . methylPREDNISolone (SOLU-MEDROL) injection  1 mg/kg Intravenous Q8H  . multivitamin with minerals  1 tablet Oral Daily  . zinc sulfate  220 mg Oral Daily   Continuous Infusions: . amiodarone 30 mg/hr (02/08/20 2230)     LOS: 5 days     Enzo Bi, MD [facility] If 7PM-7AM, please contact night-coverage 02/09/2020, 2:22 PM

## 2020-02-09 NOTE — Progress Notes (Signed)
Patient Name: Alfred Clements Date of Encounter: 02/09/2020  Hospital Problem List     Active Problems:   Atypical pneumonia    Patient Profile     59 y.o.malewith history ofhypertension who presented to the emergency room with shortness of breath cough and wheezing.  He had exposure to COVID-19 positive patient in his home. He was not vaccinated. He was COVID-19 PCR positive and influenza negative. Chest x-ray revealed atypical pneumonia. EKG showed sinus rhythm with a rate of 95 PACs. He was treated with remdesivir and Solu-Medrol. He was continued with baricitinib.He developed atrial fibrillation with rapid ventricular response. He was placed on amiodarone IV . Started on apixaban 5 mg twice daily. His CHA2DS2-VASc score is 0. He is less than 5 now hypertension or diabetes.  Subjective      Inpatient Medications    . apixaban  5 mg Oral BID  . vitamin C  500 mg Oral Daily  . aspirin EC  81 mg Oral Daily  . baricitinib  4 mg Oral Daily  . cholecalciferol  1,000 Units Oral Daily  . diltiazem  60 mg Oral Q6H  . famotidine  20 mg Oral BID  . feeding supplement  237 mL Oral BID BM  . FLUoxetine  40 mg Oral QHS  . gabapentin  300 mg Oral QHS  . guaiFENesin  600 mg Oral BID  . hydrOXYzine  25 mg Oral BID  . insulin aspart  0-15 Units Subcutaneous TID WC  . insulin aspart  0-5 Units Subcutaneous QHS  . Ipratropium-Albuterol  1 puff Inhalation QID  . methylPREDNISolone (SOLU-MEDROL) injection  1 mg/kg Intravenous Q8H  . multivitamin with minerals  1 tablet Oral Daily  . zinc sulfate  220 mg Oral Daily    Vital Signs    Vitals:   02/09/20 0515 02/09/20 0530 02/09/20 0600 02/09/20 0824  BP:  100/83 95/74 108/89  Pulse:  69  (!) 108  Resp:  20  20  Temp:    98.6 F (37 C)  TempSrc:    Oral  SpO2: 96% 98% 96% 98%  Weight:      Height:        Intake/Output Summary (Last 24 hours) at 02/09/2020 0848 Last data filed at 02/09/2020 0643 Gross per 24 hour   Intake 621.97 ml  Output 1600 ml  Net -978.03 ml   Filed Weights   02/07/20 0300 02/08/20 0436 02/09/20 0444  Weight: 91.9 kg 91.7 kg 91.7 kg    Physical Exam     Labs    CBC Recent Labs    02/08/20 0544 02/09/20 0551  WBC 15.3* 17.4*  HGB 16.0 15.1  HCT 47.1 45.7  MCV 94.8 96.6  PLT 409* 628*   Basic Metabolic Panel Recent Labs    02/08/20 0544 02/09/20 0551  NA 137 140  K 3.9 4.3  CL 100 102  CO2 27 27  GLUCOSE 210* 215*  BUN 34* 39*  CREATININE 1.14 1.23  CALCIUM 8.6* 8.5*  MG 3.0* 3.1*   Liver Function Tests No results for input(s): AST, ALT, ALKPHOS, BILITOT, PROT, ALBUMIN in the last 72 hours. No results for input(s): LIPASE, AMYLASE in the last 72 hours. Cardiac Enzymes No results for input(s): CKTOTAL, CKMB, CKMBINDEX, TROPONINI in the last 72 hours. BNP No results for input(s): BNP in the last 72 hours. D-Dimer No results for input(s): DDIMER in the last 72 hours. Hemoglobin A1C Recent Labs    02/08/20 0544  HGBA1C 6.5*  Fasting Lipid Panel No results for input(s): CHOL, HDL, LDLCALC, TRIG, CHOLHDL, LDLDIRECT in the last 72 hours. Thyroid Function Tests No results for input(s): TSH, T4TOTAL, T3FREE, THYROIDAB in the last 72 hours.  Invalid input(s): FREET3  Telemetry    afib with variable but fairly rapid vr  ECG    afib with rvr  Radiology    DG Chest 2 View  Result Date: 01/18/2020 CLINICAL DATA:  59 year old male with shortness of breath. EXAM: CHEST - 2 VIEW COMPARISON:  None FINDINGS: Bilateral streaky pulmonary densities may represent edema but concerning for atypical infection. Clinical correlation is recommended. No focal consolidation, pleural effusion, pneumothorax. The cardiac silhouette is within limits. No acute osseous pathology. IMPRESSION: Findings may represent edema but concerning for atypical infection. Clinical correlation is recommended. Electronically Signed   By: Anner Crete M.D.   On: 02/05/2020 19:26     Assessment & Plan    59 y.o.malewith history ofhypertension who presented to the emergency room with shortness of breath cough and wheezing. He was noted to have fever chills with loss of taste and smell. He had exposure to COVID-19 positive patient in his home. He was not vaccinated. He was COVID-19 PCR positive and influenza negative. Chest x-ray revealed atypical pneumonia. EKG showed sinus rhythm with a rate of 95 PACs. He was treated with remdesivir and Solu-Medrol. He was continued with baricitinib.He developed atrial fibrillation with rapid ventricular response. He was placed on amiodarone IV as well as IV diltiazem. Started on apixaban 5 mg twice daily. His CHA2DS2-VASc score is 0. He is less than 64 now hypertension or diabetes.   1. COVID-19-currently on aggressive therapy with remdesivir, baricitiniband Solu-Medrol. Still has increasing requirements of high flow oxygen  2. Atrial fibrillation-developed A. fib after presentation. Unclear whether he had a preceding history of this. Currently on amiodarone at maintenance IV at 30 mg at present. Rate is still not well controlled however improved transiently after a repeat bolus of amiodarone.  Still in A. fib with fairly rapid ventricular response although does have some periods of bradycardia improves to the low 1 teens.  Hemodynamics are somewhat variable.  Likely this is being driven by his increased work of breathing.  Will attempt to increase the Cardizem p.o. to 60 every 6 following hemodynamics.  May need to consider digoxin also given decreased renal function well for this for now. Would defer cardioversion and let us know other options due to comorbidities. Continue with Eliquis.  Signed, Javier Docker Cooper Moroney MD 02/09/2020, 8:48 AM  Pager: [phone]

## 2020-02-09 NOTE — Progress Notes (Signed)
Oxygen needs during the night increased from Heated HFNC 70% to presently Heated HFNC 95% with 100% NRB simutaneously.

## 2020-02-10 DIAGNOSIS — I4891 Unspecified atrial fibrillation: Secondary | ICD-10-CM | POA: Diagnosis not present

## 2020-02-10 DIAGNOSIS — J1282 Pneumonia due to coronavirus disease 2019: Secondary | ICD-10-CM | POA: Diagnosis not present

## 2020-02-10 DIAGNOSIS — U071 COVID-19: Secondary | ICD-10-CM | POA: Diagnosis not present

## 2020-02-10 LAB — BASIC METABOLIC PANEL
Anion gap: 12 (ref 5–15)
BUN: 40 mg/dL — ABNORMAL HIGH (ref 8–23)
CO2: 27 mmol/L (ref 22–32)
Calcium: 8.7 mg/dL — ABNORMAL LOW (ref 8.9–10.3)
Chloride: 99 mmol/L (ref 98–111)
Creatinine, Ser: 1.16 mg/dL (ref 0.61–1.24)
GFR, Estimated: >60 mL/min (ref >60)
Glucose, Bld: 203 mg/dL — ABNORMAL HIGH (ref 70–99)
Potassium: 4.4 mmol/L (ref 3.5–5.1)
Sodium: 138 mmol/L (ref 135–145)

## 2020-02-10 LAB — CBC
HCT: 45.4 % (ref 39.0–52.0)
Hemoglobin: 15.3 g/dL (ref 13.0–17.0)
MCH: 32.3 pg (ref 26.0–34.0)
MCHC: 33.7 g/dL (ref 30.0–36.0)
MCV: 95.8 fL (ref 80.0–100.0)
Platelets: 482 10*3/uL — ABNORMAL HIGH (ref 150–400)
RBC: 4.74 MIL/uL (ref 4.22–5.81)
RDW: 12.9 % (ref 11.5–15.5)
WBC: 17.3 10*3/uL — ABNORMAL HIGH (ref 4.0–10.5)
nRBC: 0 % (ref 0.0–0.2)

## 2020-02-10 LAB — GLUCOSE, CAPILLARY
Glucose-Capillary: 240 mg/dL — ABNORMAL HIGH (ref 70–99)
Glucose-Capillary: 242 mg/dL — ABNORMAL HIGH (ref 70–99)
Glucose-Capillary: 199 mg/dL — ABNORMAL HIGH (ref 70–99)
Glucose-Capillary: 226 mg/dL — ABNORMAL HIGH (ref 70–99)

## 2020-02-10 LAB — C-REACTIVE PROTEIN: CRP: 4.2 mg/dL — ABNORMAL HIGH (ref <1.0)

## 2020-02-10 LAB — MAGNESIUM: Magnesium: 3.2 mg/dL — ABNORMAL HIGH (ref 1.7–2.4)

## 2020-02-10 MED ORDER — AMIODARONE HCL 200 MG PO TABS
200.0000 mg | ORAL_TABLET | Freq: Two times a day (BID) | ORAL | Status: DC
  Administered 2020-02-10 (×2): 200 mg via ORAL
  Filled 2020-02-10 (×3): qty 1

## 2020-02-10 NOTE — Progress Notes (Signed)
PROGRESS NOTE    Alfred Clements  NWG:[id number] DOB: 08-02-1960 DOA: 02/03/2020 PCP: Baxter Hire, MD    Assessment & Plan:   Active Problems:   Atypical pneumonia    Alfred Clements  is a 59 y.o. Hispanic male with a known history of depression and hypertension, who presented to the emergency room with acute onset of dyspnea and fever as well as dry cough and wheezing since Monday.  He admits to fever and chills as well as loss of taste and smell.  He has been having fatigue and tiredness.  He admitted to diarrhea with bright red bleeding per rectum or melena.  No nausea or vomiting.  He has been exposed to his son who was diagnosed with COVID-19 a couple days ago.  2 daughters also have similar symptoms.  The patient was noted to have hypoxemia with pulse oximetry has been ranging 88 to 89% on room air.  The patient has not been vaccinated for COVID-19.  1. Acute hypoxemic respiratory failure secondary to COVID-19. --documented O2 sat 86% on room air, was put on 3L.  O2 requirement has since increased to needing heated hf.  Unable to wean. --Continue supplemental O2 to keep sats >=88%, wean as tolerated  2. Multifocal pneumonia secondary to COVID-19. --CXR showed "Bilateral streaky pulmonary densities".  BNP 40. --CRP 25.1 on presentation, started on solumedrol, trending down but remained elevated --completed Remdesivir. PLAN: --cont solumedrol q8h until O2 requirement starts to decrease --ensure CRP trending down with enough steroid --cont Baricitinib  --cont Combivent QID  # Afib w RVR --appeared to have developed after presentation. --s/p IV dilt 10 mg f/b dilt gtt@ 15 in the ED, but still not rate controlled.  Started on amiodarone bolus f/b gtt, repeated bolus once. --still not rate controlled today.  BP intermittently soft. --cardiology consulted PLAN: --cont amiodarone 200 mg BID --cont dilt 60 mg q6h (while monitoring BP) --cont Eliquis in case pt needs  cardioversion  3.  Hypokalemia. --monitor and replete PRN  4.  acute kidney injury, ruled out --Cr 1.34 on presentation.  Baseline appeared to be around 1.1.  Does not meet criteria for AKI  Hyponatremia --was started on MIVF on admission, since d/c'ed. --encourage oral hydration.  5.  Hx of Essential hypertension, not currently active --not on home BP meds. --BP currently on the low side. PLAN: --cont dilt 60 mg q6h (for rate control), monitor BP before dosing.  6.  Depression. Continue Prozac.  GERD --cont Pepcid  Hyperglycemia --no prior hx of DM.   --A1c 6.5 --ACHS SSI   DVT prophylaxis: Lovenox SQ Code Status: Full code  Family Communication:  Status is: inpatient Dispo:   The patient is from: home Anticipated d/c is to: home Anticipated d/c date is: > 3 days Patient currently is not medically stable to d/c due to: covid on heated hf, afib w RVR uncontrolled   Subjective and Interval History:  No nausea today.  Reported no BM in a while.  Still needing high settings on heated hf.     Objective: Vitals:   02/10/20 0810 02/10/20 0840 02/10/20 1150 02/10/20 1402  BP: 103/80  (!) 118/94   Pulse: 92  (!) 119   Resp: 20  18   Temp: 98.2 F (36.8 C)  98 F (36.7 C)   TempSrc: Oral  Oral   SpO2: 96% 93% 95% 94%  Weight:      Height:        Intake/Output Summary (Last 24 hours)  at 02/10/2020 1517 Last data filed at 02/10/2020 1154 Gross per 24 hour  Intake 240 ml  Output 1875 ml  Net -1635 ml   Filed Weights   02/07/20 0300 02/08/20 0436 02/09/20 0444  Weight: 91.9 kg 91.7 kg 91.7 kg    Examination:   Constitutional: NAD, AAOx3 HEENT: conjunctivae and lids normal, EOMI CV: irregular, tachycardic, No cyanosis.   RESP: crackles over posterior left base, on heated hf Extremities: No effusions, edema in BLE SKIN: warm, dry and intact Neuro: II - XII grossly intact.   Psych: Normal mood and affect.     Data Reviewed: I have personally  reviewed following labs and imaging studies  CBC: Recent Labs  Lab 01/15/2020 1821 01/23/2020 1821 02/06/20 0426 02/07/20 0406 02/08/20 0544 02/09/20 0551 02/10/20 0400  WBC 6.2   < > 10.9* 15.2* 15.3* 17.4* 17.3*  NEUTROABS 5.1  --   --   --   --   --   --   HGB 15.1   < > 15.9 15.9 16.0 15.1 15.3  HCT 43.8   < > 46.7 47.1 47.1 45.7 45.4  MCV 92.6   < > 93.2 94.4 94.8 96.6 95.8  PLT 188   < > 274 357 409* 442* 482*   < > = values in this interval not displayed.   Basic Metabolic Panel: Recent Labs  Lab 02/06/20 0426 02/07/20 0406 02/08/20 0544 02/09/20 0551 02/10/20 0400  NA 136 137 137 140 138  K 3.6 4.1 3.9 4.3 4.4  CL 99 98 100 102 99  CO2 23 26 27 27 27   GLUCOSE 196* 203* 210* 215* 203*  BUN 33* 37* 34* 39* 40*  CREATININE 1.18 1.32* 1.14 1.23 1.16  CALCIUM 8.6* 8.6* 8.6* 8.5* 8.7*  MG 2.6* 3.0* 3.0* 3.1* 3.2*   GFR: Estimated Creatinine Clearance: 75.5 mL/min (by C-G formula based on SCr of 1.16 mg/dL). Liver Function Tests: Recent Labs  Lab 01/16/2020 1821  AST 56*  ALT 20  ALKPHOS 50  BILITOT 0.6  PROT 8.5*  ALBUMIN 3.7   No results for input(s): LIPASE, AMYLASE in the last 168 hours. No results for input(s): AMMONIA in the last 168 hours. Coagulation Profile: No results for input(s): INR, PROTIME in the last 168 hours. Cardiac Enzymes: No results for input(s): CKTOTAL, CKMB, CKMBINDEX, TROPONINI in the last 168 hours. BNP (last 3 results) No results for input(s): PROBNP in the last 8760 hours. HbA1C: Recent Labs    02/08/20 0544  HGBA1C 6.5*   CBG: Recent Labs  Lab 02/09/20 1226 02/09/20 1634 02/09/20 2101 02/10/20 0809 02/10/20 1152  GLUCAP 220* 182* 171* 240* 242*   Lipid Profile: No results for input(s): CHOL, HDL, LDLCALC, TRIG, CHOLHDL, LDLDIRECT in the last 72 hours. Thyroid Function Tests: No results for input(s): TSH, T4TOTAL, FREET4, T3FREE, THYROIDAB in the last 72 hours. Anemia Panel: No results for input(s): VITAMINB12,  FOLATE, FERRITIN, TIBC, IRON, RETICCTPCT in the last 72 hours. Sepsis Labs: Recent Labs  Lab 01/18/2020 2100  PROCALCITON 0.20    Recent Results (from the past 240 hour(s))  Resp Panel by RT-PCR (Flu A&B, Covid) Nasopharyngeal Swab     Status: Abnormal   Collection Time: 01/19/2020  8:59 PM   Specimen: Nasopharyngeal Swab; Nasopharyngeal(NP) swabs in vial transport medium  Result Value Ref Range Status   SARS Coronavirus 2 by RT PCR POSITIVE (A) NEGATIVE Final    Comment: RESULT CALLED TO, READ BACK BY AND VERIFIED WITH: MCKENZIE WALLACE @2333  ON 02/05/2020  SKL (NOTE) SARS-CoV-2 target nucleic acids are DETECTED.  The SARS-CoV-2 RNA is generally detectable in upper respiratory specimens during the acute phase of infection. Positive results are indicative of the presence of the identified virus, but do not rule out bacterial infection or co-infection with other pathogens not detected by the test. Clinical correlation with patient history and other diagnostic information is necessary to determine patient infection status. The expected result is Negative.  Fact Sheet for Patients: EntrepreneurPulse.com.au  Fact Sheet for Healthcare Providers: IncredibleEmployment.be  This test is not yet approved or cleared by the [place] FDA and  has been authorized for detection and/or diagnosis of SARS-CoV-2 by FDA under an Emergency Use Authorization (EUA).  This EUA will remain in effect (meaning this test c an be used) for the duration of  the COVID-19 declaration under Section 564(b)(1) of the Act, 21 U.S.C. section 360bbb-3(b)(1), unless the authorization is terminated or revoked sooner.     Influenza A by PCR NEGATIVE NEGATIVE Final   Influenza B by PCR NEGATIVE NEGATIVE Final    Comment: (NOTE) The Xpert Xpress SARS-CoV-2/FLU/RSV plus assay is intended as an aid in the diagnosis of influenza from Nasopharyngeal swab specimens and should not be  used as a sole basis for treatment. Nasal washings and aspirates are unacceptable for Xpert Xpress SARS-CoV-2/FLU/RSV testing.  Fact Sheet for Patients: EntrepreneurPulse.com.au  Fact Sheet for Healthcare Providers: IncredibleEmployment.be  This test is not yet approved or cleared by the [place] FDA and has been authorized for detection and/or diagnosis of SARS-CoV-2 by FDA under an Emergency Use Authorization (EUA). This EUA will remain in effect (meaning this test can be used) for the duration of the COVID-19 declaration under Section 564(b)(1) of the Act, 21 U.S.C. section 360bbb-3(b)(1), unless the authorization is terminated or revoked.  Performed at [facility], [address], [place], [place] [address]       Radiology Studies: No results found.   Scheduled Meds: . amiodarone  200 mg Oral BID  . apixaban  5 mg Oral BID  . vitamin C  500 mg Oral Daily  . aspirin EC  81 mg Oral Daily  . baricitinib  4 mg Oral Daily  . diltiazem  60 mg Oral Q6H  . famotidine  20 mg Oral BID  . feeding supplement  237 mL Oral BID BM  . FLUoxetine  40 mg Oral QHS  . guaiFENesin  600 mg Oral BID  . insulin aspart  0-15 Units Subcutaneous TID WC  . insulin aspart  0-5 Units Subcutaneous QHS  . Ipratropium-Albuterol  1 puff Inhalation QID  . methylPREDNISolone (SOLU-MEDROL) injection  1 mg/kg Intravenous Q8H  . multivitamin with minerals  1 tablet Oral Daily  . zinc sulfate  220 mg Oral Daily   Continuous Infusions:    LOS: 6 days     Enzo Bi, MD [facility] If 7PM-7AM, please contact night-coverage 02/10/2020, 3:17 PM

## 2020-02-10 NOTE — Progress Notes (Addendum)
Patient Name: Alfred Clements Date of Encounter: 02/10/2020  Hospital Problem List     Active Problems:   Atypical pneumonia    Patient Profile     59 y.o.malewith history ofhypertension who presented to the emergency room with shortness of breath cough and wheezing.  He had exposure to COVID-19 positive patient in his home. He was not vaccinated. He was COVID-19 PCR positive and influenza negative. Chest x-ray revealed atypical pneumonia. EKG showed sinus rhythm with a rate of 95 PACs. He was treated with remdesivir and Solu-Medrol. He was continued with baricitinib.He developed atrial fibrillation with rapid ventricular response. He was placed on amiodarone IV . Started on apixaban 5 mg twice daily. His CHA2DS2-VASc score is 0. He is less than 8 now hypertension or diabetes.  Subjective     Inpatient Medications    . apixaban  5 mg Oral BID  . vitamin C  500 mg Oral Daily  . aspirin EC  81 mg Oral Daily  . baricitinib  4 mg Oral Daily  . diltiazem  60 mg Oral Q6H  . famotidine  20 mg Oral BID  . feeding supplement  237 mL Oral BID BM  . FLUoxetine  40 mg Oral QHS  . guaiFENesin  600 mg Oral BID  . insulin aspart  0-15 Units Subcutaneous TID WC  . insulin aspart  0-5 Units Subcutaneous QHS  . Ipratropium-Albuterol  1 puff Inhalation QID  . methylPREDNISolone (SOLU-MEDROL) injection  1 mg/kg Intravenous Q8H  . multivitamin with minerals  1 tablet Oral Daily  . zinc sulfate  220 mg Oral Daily    Vital Signs    Vitals:   02/10/20 0411 02/10/20 0540 02/10/20 0545 02/10/20 0600  BP:  112/76  104/78  Pulse:      Resp: 20 20  19   Temp:  97.7 F (36.5 C)    TempSrc:  Oral    SpO2: 94% 96% 96% 93%  Weight:      Height:        Intake/Output Summary (Last 24 hours) at 02/10/2020 0756 Last data filed at 02/10/2020 0543 Gross per 24 hour  Intake 600 ml  Output 1900 ml  Net -1300 ml   Filed Weights   02/07/20 0300 02/08/20 0436 02/09/20 0444   Weight: 91.9 kg 91.7 kg 91.7 kg    Physical Exam     Labs    CBC Recent Labs    02/09/20 0551 02/10/20 0400  WBC 17.4* 17.3*  HGB 15.1 15.3  HCT 45.7 45.4  MCV 96.6 95.8  PLT 442* 831*   Basic Metabolic Panel Recent Labs    02/09/20 0551 02/10/20 0400  NA 140 138  K 4.3 4.4  CL 102 99  CO2 27 27  GLUCOSE 215* 203*  BUN 39* 40*  CREATININE 1.23 1.16  CALCIUM 8.5* 8.7*  MG 3.1* 3.2*   Liver Function Tests No results for input(s): AST, ALT, ALKPHOS, BILITOT, PROT, ALBUMIN in the last 72 hours. No results for input(s): LIPASE, AMYLASE in the last 72 hours. Cardiac Enzymes No results for input(s): CKTOTAL, CKMB, CKMBINDEX, TROPONINI in the last 72 hours. BNP No results for input(s): BNP in the last 72 hours. D-Dimer No results for input(s): DDIMER in the last 72 hours. Hemoglobin A1C Recent Labs    02/08/20 0544  HGBA1C 6.5*   Fasting Lipid Panel No results for input(s): CHOL, HDL, LDLCALC, TRIG, CHOLHDL, LDLDIRECT in the last 72 hours. Thyroid Function Tests No results for input(s):  TSH, T4TOTAL, T3FREE, THYROIDAB in the last 72 hours.  Invalid input(s): FREET3  Telemetry    afib  ECG    afib  Radiology    DG Chest 2 View  Result Date: 02/01/2020 CLINICAL DATA:  59 year old male with shortness of breath. EXAM: CHEST - 2 VIEW COMPARISON:  None FINDINGS: Bilateral streaky pulmonary densities may represent edema but concerning for atypical infection. Clinical correlation is recommended. No focal consolidation, pleural effusion, pneumothorax. The cardiac silhouette is within limits. No acute osseous pathology. IMPRESSION: Findings may represent edema but concerning for atypical infection. Clinical correlation is recommended. Electronically Signed   By: Anner Crete M.D.   On: 01/16/2020 19:26    Assessment & Plan    59 y.o.malewith history ofhypertension who presented to the emergency room with shortness of breath cough and wheezing. He was  noted to have fever chills with loss of taste and smell. He had exposure to COVID-19 positive patient in his home. He was not vaccinated. He was COVID-19 PCR positive and influenza negative. Chest x-ray revealed atypical pneumonia. EKG showed sinus rhythm with a rate of 95 PACs. He was treated with remdesivir and Solu-Medrol. He was continued with baricitinib.He developed atrial fibrillation with rapid ventricular response. He was placed on amiodarone IV as well as IV diltiazem. Started on apixaban 5 mg twice daily. His CHA2DS2-VASc score is 0. He is less than 57 now hypertension or diabetes.   1. COVID-19-currently on aggressive therapy with remdesivir, baricitiniband Solu-Medrol. Still has increasing requirements of high flow oxygen  2. Atrial fibrillation-developed A. fib after presentation. Unclear whether he had a preceding history of this. Currently on amiodarone at maintenance IV at 30 mg at present. Will attempt to convert to po amiodarone. 200 bid  Still in A. fib with improved ventricular response although does have some periods of bradycardia improves to the low 1 teens.  Hemodynamics are somewhat variable.  Likely this is being driven by his increased work of breathing.  Will continue Cardizem p.o.  60 every 6 following hemodynamics.  May need to consider digoxin also given decreased renal function well for this for now. Would continue to defer cardioversion and let us know other options due to comorbidities. Continue with Eliquis.  Signed, Javier Docker Weslie Pretlow MD 02/10/2020, 7:56 AM  Pager: [phone]

## 2020-02-11 DIAGNOSIS — I4891 Unspecified atrial fibrillation: Secondary | ICD-10-CM | POA: Diagnosis not present

## 2020-02-11 DIAGNOSIS — U071 COVID-19: Secondary | ICD-10-CM | POA: Diagnosis not present

## 2020-02-11 DIAGNOSIS — J9601 Acute respiratory failure with hypoxia: Secondary | ICD-10-CM | POA: Diagnosis not present

## 2020-02-11 LAB — CBC
HCT: 48.1 % (ref 39.0–52.0)
Hemoglobin: 15.6 g/dL (ref 13.0–17.0)
MCH: 31.7 pg (ref 26.0–34.0)
MCHC: 32.4 g/dL (ref 30.0–36.0)
MCV: 97.8 fL (ref 80.0–100.0)
Platelets: 607 10*3/uL — ABNORMAL HIGH (ref 150–400)
RBC: 4.92 MIL/uL (ref 4.22–5.81)
RDW: 12.5 % (ref 11.5–15.5)
WBC: 19.6 10*3/uL — ABNORMAL HIGH (ref 4.0–10.5)
nRBC: 0 % (ref 0.0–0.2)

## 2020-02-11 LAB — BASIC METABOLIC PANEL
Anion gap: 11 (ref 5–15)
BUN: 39 mg/dL — ABNORMAL HIGH (ref 8–23)
CO2: 28 mmol/L (ref 22–32)
Calcium: 8.6 mg/dL — ABNORMAL LOW (ref 8.9–10.3)
Chloride: 98 mmol/L (ref 98–111)
Creatinine, Ser: 1.3 mg/dL — ABNORMAL HIGH (ref 0.61–1.24)
GFR, Estimated: >60 mL/min (ref >60)
Glucose, Bld: 265 mg/dL — ABNORMAL HIGH (ref 70–99)
Potassium: 4.4 mmol/L (ref 3.5–5.1)
Sodium: 137 mmol/L (ref 135–145)

## 2020-02-11 LAB — MAGNESIUM: Magnesium: 2.7 mg/dL — ABNORMAL HIGH (ref 1.7–2.4)

## 2020-02-11 LAB — GLUCOSE, CAPILLARY
Glucose-Capillary: 293 mg/dL — ABNORMAL HIGH (ref 70–99)
Glucose-Capillary: 201 mg/dL — ABNORMAL HIGH (ref 70–99)
Glucose-Capillary: 176 mg/dL — ABNORMAL HIGH (ref 70–99)
Glucose-Capillary: 207 mg/dL — ABNORMAL HIGH (ref 70–99)

## 2020-02-11 LAB — C-REACTIVE PROTEIN: CRP: 4.3 mg/dL — ABNORMAL HIGH (ref <1.0)

## 2020-02-11 MED ORDER — AMIODARONE HCL 200 MG PO TABS
400.0000 mg | ORAL_TABLET | Freq: Two times a day (BID) | ORAL | Status: DC
  Administered 2020-02-11 – 2020-02-18 (×14): 400 mg via ORAL
  Filled 2020-02-11 (×14): qty 2

## 2020-02-11 MED ORDER — POLYETHYLENE GLYCOL 3350 17 G PO PACK
34.0000 g | PACK | Freq: Two times a day (BID) | ORAL | Status: DC
  Administered 2020-02-11 – 2020-02-15 (×6): 34 g via ORAL
  Filled 2020-02-11 (×9): qty 2

## 2020-02-11 MED ORDER — INSULIN GLARGINE 100 UNIT/ML ~~LOC~~ SOLN
9.0000 [IU] | Freq: Every day | SUBCUTANEOUS | Status: DC
  Administered 2020-02-11 – 2020-02-12 (×2): 9 [IU] via SUBCUTANEOUS
  Filled 2020-02-11 (×2): qty 0.09

## 2020-02-11 NOTE — Plan of Care (Signed)
  Problem: Coping: Goal: Level of anxiety will decrease Outcome: Progressing   Problem: Pain Managment: Goal: General experience of comfort will improve Outcome: Progressing   Problem: Respiratory: Goal: Will maintain a patent airway Outcome: Progressing   Problem: Education: Goal: Knowledge of risk factors and measures for prevention of condition will improve Outcome: Progressing

## 2020-02-11 NOTE — Progress Notes (Signed)
Patient Name: Alfred Clements Date of Encounter: 02/11/2020  Hospital Problem List     Active Problems:   Atypical pneumonia    Patient Profile     60 y.o.malewith history ofhypertension who presented to the emergency room with shortness of breath cough and wheezing. He had exposure to COVID-19 positive patient in his home. He was not vaccinated. He was COVID-19 PCR positive and influenza negative. Chest x-ray revealed atypical pneumonia. EKG showed sinus rhythm with a rate of 95 PACs. He was treated with remdesivir and Solu-Medrol. He was continued with baricitinib.He developed atrial fibrillation with rapid ventricular response. He was placed on amiodarone IV.Started on apixaban 5 mg twice daily. His CHA2DS2-VASc score is 0. He is less than 68 now hypertension or diabetes.   Subjective     Inpatient Medications    . amiodarone  400 mg Oral BID  . apixaban  5 mg Oral BID  . vitamin C  500 mg Oral Daily  . aspirin EC  81 mg Oral Daily  . baricitinib  4 mg Oral Daily  . diltiazem  60 mg Oral Q6H  . famotidine  20 mg Oral BID  . feeding supplement  237 mL Oral BID BM  . FLUoxetine  40 mg Oral QHS  . guaiFENesin  600 mg Oral BID  . insulin aspart  0-15 Units Subcutaneous TID WC  . insulin aspart  0-5 Units Subcutaneous QHS  . Ipratropium-Albuterol  1 puff Inhalation QID  . methylPREDNISolone (SOLU-MEDROL) injection  1 mg/kg Intravenous Q8H  . multivitamin with minerals  1 tablet Oral Daily  . polyethylene glycol  34 g Oral BID  . zinc sulfate  220 mg Oral Daily    Vital Signs    Vitals:   02/10/20 2210 02/11/20 0337 02/11/20 0510 02/11/20 0606  BP: (!) 112/91 (!) 124/93 121/88   Pulse: 95 (!) 54    Resp: 16 16    Temp: 97.9 F (36.6 C) 97.8 F (36.6 C)    TempSrc: Oral Oral    SpO2: 94% 94%    Weight:    90.7 kg  Height:        Intake/Output Summary (Last 24 hours) at 02/11/2020 0748 Last data filed at 02/11/2020 5284 Gross per 24 hour   Intake 240 ml  Output 1050 ml  Net -810 ml   Filed Weights   02/08/20 0436 02/09/20 0444 02/11/20 0606  Weight: 91.7 kg 91.7 kg 90.7 kg    Physical Exam      Labs    CBC Recent Labs    02/10/20 0400 02/11/20 0426  WBC 17.3* 19.6*  HGB 15.3 15.6  HCT 45.4 48.1  MCV 95.8 97.8  PLT 482* 132*   Basic Metabolic Panel Recent Labs    02/10/20 0400 02/11/20 0426  NA 138 137  K 4.4 4.4  CL 99 98  CO2 27 28  GLUCOSE 203* 265*  BUN 40* 39*  CREATININE 1.16 1.30*  CALCIUM 8.7* 8.6*  MG 3.2* 2.7*   Liver Function Tests No results for input(s): AST, ALT, ALKPHOS, BILITOT, PROT, ALBUMIN in the last 72 hours. No results for input(s): LIPASE, AMYLASE in the last 72 hours. Cardiac Enzymes No results for input(s): CKTOTAL, CKMB, CKMBINDEX, TROPONINI in the last 72 hours. BNP No results for input(s): BNP in the last 72 hours. D-Dimer No results for input(s): DDIMER in the last 72 hours. Hemoglobin A1C No results for input(s): HGBA1C in the last 72 hours. Fasting Lipid Panel No  results for input(s): CHOL, HDL, LDLCALC, TRIG, CHOLHDL, LDLDIRECT in the last 72 hours. Thyroid Function Tests No results for input(s): TSH, T4TOTAL, T3FREE, THYROIDAB in the last 72 hours.  Invalid input(s): FREET3  Telemetry    afib with rvr  ECG    afib  Radiology    DG Chest 2 View  Result Date: 01/22/2020 CLINICAL DATA:  59 year old male with shortness of breath. EXAM: CHEST - 2 VIEW COMPARISON:  None FINDINGS: Bilateral streaky pulmonary densities may represent edema but concerning for atypical infection. Clinical correlation is recommended. No focal consolidation, pleural effusion, pneumothorax. The cardiac silhouette is within limits. No acute osseous pathology. IMPRESSION: Findings may represent edema but concerning for atypical infection. Clinical correlation is recommended. Electronically Signed   By: Anner Crete M.D.   On: 01/13/2020 19:26    Assessment & Plan       59 y.o.malewith history ofhypertension who presented to the emergency room with shortness of breath cough and wheezing. He was noted to have fever chills with loss of taste and smell. He had exposure to COVID-19 positive patient in his home. He was not vaccinated. He was COVID-19 PCR positive and influenza negative. Chest x-ray revealed atypical pneumonia. EKG showed sinus rhythm with a rate of 95 PACs. He was treated with remdesivir and Solu-Medrol. He was continued with baricitinib.He developed atrial fibrillation with rapid ventricular response. He was placed on amiodarone IV as well as IV diltiazem. Started on apixaban 5 mg twice daily. His CHA2DS2-VASc score is 0. He is less than 22 now hypertension or diabetes.   1. COVID-19-currently on aggressive therapy.  2. Atrial fibrillation-developed A. fib after presentation. Unclear whether he had a preceding history of this. Currently on amiodarone at maintenance IV at 30 mg at present. Will attempt to convert to po amiodarone. 200 bid Still in A. fib with improved ventricular response although does have some periods of bradycardia improves to the low 1 teens. Hemodynamics are somewhat variable. Likely this is being driven by his increased work of breathing. Will continue Cardizem p.o.  60 every 6 following hemodynamics.May need to consider digoxin also given decreased renal function well for this for now. Would continue to defer cardioversion for now given airspace disease, maintaining nsr is unlikely. Continue with Eliquis.  Signed, Javier Docker Teodor Prater MD 02/11/2020, 7:48 AM  Pager: [phone]

## 2020-02-11 NOTE — Plan of Care (Signed)
Provider notified regarding pt HR sustaining in the 130-140s. All other vitals stable. PO cardizem given, HR now 110-120 and intermittently in the 130-140 but not sustaining. Provider aware.  Problem: Education: Goal: Knowledge of General Education information will improve Description: Including pain rating scale, medication(s)/side effects and non-pharmacologic comfort measures Outcome: Progressing   Problem: Health Behavior/Discharge Planning: Goal: Ability to manage health-related needs will improve Outcome: Progressing   Problem: Clinical Measurements: Goal: Ability to maintain clinical measurements within normal limits will improve Outcome: Progressing Goal: Will remain free from infection Outcome: Progressing Goal: Diagnostic test results will improve Outcome: Progressing Goal: Respiratory complications will improve Outcome: Progressing Goal: Cardiovascular complication will be avoided Outcome: Progressing   Problem: Activity: Goal: Risk for activity intolerance will decrease Outcome: Progressing   Problem: Nutrition: Goal: Adequate nutrition will be maintained Outcome: Progressing   Problem: Coping: Goal: Level of anxiety will decrease Outcome: Progressing   Problem: Elimination: Goal: Will not experience complications related to bowel motility Outcome: Progressing Goal: Will not experience complications related to urinary retention Outcome: Progressing   Problem: Pain Managment: Goal: General experience of comfort will improve Outcome: Progressing   Problem: Safety: Goal: Ability to remain free from injury will improve Outcome: Progressing   Problem: Skin Integrity: Goal: Risk for impaired skin integrity will decrease Outcome: Progressing   Problem: Education: Goal: Knowledge of risk factors and measures for prevention of condition will improve Outcome: Progressing   Problem: Coping: Goal: Psychosocial and spiritual needs will be supported Outcome:  Progressing   Problem: Respiratory: Goal: Will maintain a patent airway Outcome: Progressing Goal: Complications related to the disease process, condition or treatment will be avoided or minimized Outcome: Progressing

## 2020-02-11 NOTE — Progress Notes (Signed)
Inpatient Diabetes Program Recommendations  AACE/ADA: New Consensus Statement on Inpatient Glycemic Control  Target Ranges:  Prepandial:   less than 140 mg/dL      Peak postprandial:   less than 180 mg/dL (1-2 hours)      Critically ill patients:  140 - 180 mg/dL   Results for Alfred Clements, Alfred Clements (MRN [id number]) as of 02/11/2020 10:50  Ref. Range 02/10/2020 08:09 02/10/2020 11:52 02/10/2020 16:43 02/10/2020 20:57 02/11/2020 07:56  Glucose-Capillary Latest Ref Range: 70 - 99 mg/dL 240 (H) 242 (H) 199 (H) 226 (H) 293 (H)  Results for Alfred Clements, Alfred Clements (MRN [id number]) as of 02/11/2020 10:50  Ref. Range 02/08/2020 05:44  Hemoglobin A1C Latest Ref Range: 4.8 - 5.6 % 6.5 (H)   Review of Glycemic Control  Diabetes history: No Outpatient Diabetes medications: NA Current orders for Inpatient glycemic control: Novolog 0-15 units TID with meals, Novolog 0-5 units QHS; Solumedrol 98.125 Q8H  Inpatient Diabetes Program Recommendations:    Insulin: If steroids are continued as ordered, please consider ordering Lantus 9 units Q24H.  HbgA1C: A1C 6.5% on 02/08/20 indicating an average glucose of 140 mg/dl. Patient was admitted on 02/07/2020 and began receiving steroids on 01/13/2020. Would recommend patient have A1C repeated in 3-4 months after steroids completed.  Thanks, Barnie Alderman, RN, MSN, CDE Diabetes Coordinator Inpatient Diabetes Program [phone] (Team Pager from 8am to 5pm)

## 2020-02-11 NOTE — Plan of Care (Signed)
  Problem: Education: Goal: Knowledge of General Education information will improve Description: Including pain rating scale, medication(s)/side effects and non-pharmacologic comfort measures Outcome: Adequate for Discharge   Problem: Health Behavior/Discharge Planning: Goal: Ability to manage health-related needs will improve Outcome: Adequate for Discharge   Problem: Clinical Measurements: Goal: Ability to maintain clinical measurements within normal limits will improve Outcome: Adequate for Discharge Goal: Will remain free from infection Outcome: Adequate for Discharge Goal: Diagnostic test results will improve Outcome: Adequate for Discharge Goal: Respiratory complications will improve Outcome: Adequate for Discharge Goal: Cardiovascular complication will be avoided Outcome: Adequate for Discharge   Problem: Activity: Goal: Risk for activity intolerance will decrease Outcome: Adequate for Discharge   Problem: Nutrition: Goal: Adequate nutrition will be maintained Outcome: Adequate for Discharge   Problem: Coping: Goal: Level of anxiety will decrease Outcome: Adequate for Discharge   Problem: Elimination: Goal: Will not experience complications related to bowel motility Outcome: Adequate for Discharge Goal: Will not experience complications related to urinary retention Outcome: Adequate for Discharge   Problem: Pain Managment: Goal: General experience of comfort will improve Outcome: Adequate for Discharge   Problem: Safety: Goal: Ability to remain free from injury will improve Outcome: Adequate for Discharge   Problem: Skin Integrity: Goal: Risk for impaired skin integrity will decrease Outcome: Adequate for Discharge   Problem: Education: Goal: Knowledge of risk factors and measures for prevention of condition will improve Outcome: Adequate for Discharge   Problem: Coping: Goal: Psychosocial and spiritual needs will be supported Outcome: Adequate for  Discharge   Problem: Respiratory: Goal: Will maintain a patent airway Outcome: Adequate for Discharge Goal: Complications related to the disease process, condition or treatment will be avoided or minimized Outcome: Adequate for Discharge   Problem: Education: Goal: Knowledge of risk factors and measures for prevention of condition will improve Outcome: Adequate for Discharge   Problem: Coping: Goal: Psychosocial and spiritual needs will be supported Outcome: Adequate for Discharge   Problem: Respiratory: Goal: Will maintain a patent airway Outcome: Adequate for Discharge Goal: Complications related to the disease process, condition or treatment will be avoided or minimized Outcome: Adequate for Discharge

## 2020-02-11 NOTE — Progress Notes (Signed)
PROGRESS NOTE    Alfred Clements  PJK:[id number] DOB: [birth year] DOA: 01/12/2020 PCP: Baxter Hire, MD    Brief Narrative:  FernandoGarciais a59 y.o.Hispanic malewith a known history of depression and hypertension, who presented to the emergency room with acute onset of dyspnea and feveras well as dry cough and wheezing since Monday. He admits to fever and chills as well as loss of taste and smell. He has been having fatigue and tiredness. He admitted to diarrhea with bright red bleeding per rectum or melena. No nausea or vomiting.He has been exposed to hisson who wasdiagnosed with COVID-19 a couple days ago. 2 daughters also have similar symptoms. The patient was noted to have hypoxemia with pulseoximetryhas been ranging 88 to 89% on room air. The patient has not been vaccinated for COVID-19. As of 02/11/2020 patient remains in progressive cardiac unit dependent on heated high flow nasal cannula.  He is mentating clearly though he looks fatigued.  Normal work of breathing.   Assessment & Plan:   Active Problems:   Atypical pneumonia  Multifocal pneumonia secondary to COVID-19 Severe acute hypoxic respiratory failure secondary to above Oxygen saturation on admission 86% Oxygen requirement has gone up substantially since then Completed course of remdesivir in house Now dependent on heated high flow nasal cannula 50 L at 60% FiO2 Plan: Continue Solu-Medrol every 8 hours Follow daily inflammatory markers Continue baricitinib Stress I-S and flutter use Out of bed to chair as tolerated Combivent 4 times daily Wean oxygen as tolerated  Atrial fibrillation with rapid ventricular response No documented history of arrhythmia Development possibly due to Covid infection and hypoxia Cardiology following Rate remains uncontrolled Plan: Amiodarone 40 mg p.o. twice daily Diltiazem 60 mg every 6 hours Eliquis for anticoagulation Telemetry monitoring Cardiology  follow-up  Hypokalemia Monitor and replace as needed Maintain K greater than 4, mag greater than 2  Hyponatremia, resolved Monitor  Essential hypertension Not taking any home medications Continue continue diltiazem as above  Depression Continue home Prozac  GERD Pepcid  Hyperglycemia No history of DM Likely due to steroids Continue Accu-Cheks and sliding scale insulin  DVT prophylaxis: Lovenox Code Status: Full Family Communication: Wife Alfred Clements [phone] on 02/11/2020 Disposition Plan: Status is: Inpatient  Remains inpatient appropriate because:Inpatient level of care appropriate due to severity of illness   Dispo: The patient is from: Home              Anticipated d/c is to: Home              Anticipated d/c date is: > 3 days              Patient currently is not medically stable to d/c.   Still severely hypoxic requiring heated high flow nasal cannula.  Currently 50 L at 65%.  Will attempt to wean down as tolerated.      Consultants:   Cardiology-[facility]  Procedures:   None  Antimicrobials:   None   Subjective: Patient seen and examined.  Endorses some fatigue but no pain complaints.  Objective: Vitals:   02/11/20 0844 02/11/20 0930 02/11/20 1101 02/11/20 1211  BP:   120/81 (!) 118/92  Pulse:  83 94 95  Resp:  20 18 18   Temp:   98.6 F (37 C) 97.6 F (36.4 C)  TempSrc:   Oral   SpO2: 96% 95% 95% 97%  Weight:      Height:        Intake/Output Summary (Last 24 hours) at 02/11/2020  [place] filed at 02/11/2020 1211 Gross per 24 hour  Intake --  Output 925 ml  Net -925 ml   Filed Weights   02/08/20 0436 02/09/20 0444 02/11/20 0606  Weight: 91.7 kg 91.7 kg 90.7 kg    Examination:  General exam: Appears calm and comfortable  Respiratory system: Coarse crackles bilaterally.  Normal work of breathing.  50 L  cardiovascular system: Tachycardic, irregular rhythm, no appreciable murmurs, no pedal edema   gastrointestinal system: Abdomen is nondistended, soft and nontender. No organomegaly or masses felt. Normal bowel sounds heard. Central nervous system: Alert and oriented. No focal neurological deficits. Extremities: Symmetric 5 x 5 power. Skin: No rashes, lesions or ulcers Psychiatry: Judgement and insight appear normal. Mood & affect appropriate.     Data Reviewed: I have personally reviewed following labs and imaging studies  CBC: Recent Labs  Lab 01/21/2020 1821 02/06/20 0426 02/07/20 0406 02/08/20 0544 02/09/20 0551 02/10/20 0400 02/11/20 0426  WBC 6.2   < > 15.2* 15.3* 17.4* 17.3* 19.6*  NEUTROABS 5.1  --   --   --   --   --   --   HGB 15.1   < > 15.9 16.0 15.1 15.3 15.6  HCT 43.8   < > 47.1 47.1 45.7 45.4 48.1  MCV 92.6   < > 94.4 94.8 96.6 95.8 97.8  PLT 188   < > 357 409* 442* 482* 607*   < > = values in this interval not displayed.   Basic Metabolic Panel: Recent Labs  Lab 02/07/20 0406 02/08/20 0544 02/09/20 0551 02/10/20 0400 02/11/20 0426  NA 137 137 140 138 137  K 4.1 3.9 4.3 4.4 4.4  CL 98 100 102 99 98  CO2 26 27 27 27 28   GLUCOSE 203* 210* 215* 203* 265*  BUN 37* 34* 39* 40* 39*  CREATININE 1.32* 1.14 1.23 1.16 1.30*  CALCIUM 8.6* 8.6* 8.5* 8.7* 8.6*  MG 3.0* 3.0* 3.1* 3.2* 2.7*   GFR: Estimated Creatinine Clearance: 67 mL/min (A) (by C-G formula based on SCr of 1.3 mg/dL (H)). Liver Function Tests: Recent Labs  Lab 02/02/2020 1821  AST 56*  ALT 20  ALKPHOS 50  BILITOT 0.6  PROT 8.5*  ALBUMIN 3.7   No results for input(s): LIPASE, AMYLASE in the last 168 hours. No results for input(s): AMMONIA in the last 168 hours. Coagulation Profile: No results for input(s): INR, PROTIME in the last 168 hours. Cardiac Enzymes: No results for input(s): CKTOTAL, CKMB, CKMBINDEX, TROPONINI in the last 168 hours. BNP (last 3 results) No results for input(s): PROBNP in the last 8760 hours. HbA1C: No results for input(s): HGBA1C in the last 72  hours. CBG: Recent Labs  Lab 02/10/20 1152 02/10/20 1643 02/10/20 2057 02/11/20 0756 02/11/20 1208  GLUCAP 242* 199* 226* 293* 201*   Lipid Profile: No results for input(s): CHOL, HDL, LDLCALC, TRIG, CHOLHDL, LDLDIRECT in the last 72 hours. Thyroid Function Tests: No results for input(s): TSH, T4TOTAL, FREET4, T3FREE, THYROIDAB in the last 72 hours. Anemia Panel: No results for input(s): VITAMINB12, FOLATE, FERRITIN, TIBC, IRON, RETICCTPCT in the last 72 hours. Sepsis Labs: Recent Labs  Lab 01/28/2020 2100  PROCALCITON 0.20    Recent Results (from the past 240 hour(s))  Resp Panel by RT-PCR (Flu A&B, Covid) Nasopharyngeal Swab     Status: Abnormal   Collection Time: 01/31/2020  8:59 PM   Specimen: Nasopharyngeal Swab; Nasopharyngeal(NP) swabs in vial transport medium  Result Value Ref Range Status  SARS Coronavirus 2 by RT PCR POSITIVE (A) NEGATIVE Final    Comment: RESULT CALLED TO, READ BACK BY AND VERIFIED WITH: MCKENZIE WALLACE @2333  ON 01/20/2020 [facility] (NOTE) SARS-CoV-2 target nucleic acids are DETECTED.  The SARS-CoV-2 RNA is generally detectable in upper respiratory specimens during the acute phase of infection. Positive results are indicative of the presence of the identified virus, but do not rule out bacterial infection or co-infection with other pathogens not detected by the test. Clinical correlation with patient history and other diagnostic information is necessary to determine patient infection status. The expected result is Negative.  Fact Sheet for Patients: EntrepreneurPulse.com.au  Fact Sheet for Healthcare Providers: IncredibleEmployment.be  This test is not yet approved or cleared by the [place] FDA and  has been authorized for detection and/or diagnosis of SARS-CoV-2 by FDA under an Emergency Use Authorization (EUA).  This EUA will remain in effect (meaning this test c an be used) for the duration of  the  COVID-19 declaration under Section 564(b)(1) of the Act, 21 U.S.C. section 360bbb-3(b)(1), unless the authorization is terminated or revoked sooner.     Influenza A by PCR NEGATIVE NEGATIVE Final   Influenza B by PCR NEGATIVE NEGATIVE Final    Comment: (NOTE) The Xpert Xpress SARS-CoV-2/FLU/RSV plus assay is intended as an aid in the diagnosis of influenza from Nasopharyngeal swab specimens and should not be used as a sole basis for treatment. Nasal washings and aspirates are unacceptable for Xpert Xpress SARS-CoV-2/FLU/RSV testing.  Fact Sheet for Patients: EntrepreneurPulse.com.au  Fact Sheet for Healthcare Providers: IncredibleEmployment.be  This test is not yet approved or cleared by the [place] FDA and has been authorized for detection and/or diagnosis of SARS-CoV-2 by FDA under an Emergency Use Authorization (EUA). This EUA will remain in effect (meaning this test can be used) for the duration of the COVID-19 declaration under Section 564(b)(1) of the Act, 21 U.S.C. section 360bbb-3(b)(1), unless the authorization is terminated or revoked.  Performed at [facility], [address] [place], [place] [address]          Radiology Studies: No results found.      Scheduled Meds: . amiodarone  400 mg Oral BID  . apixaban  5 mg Oral BID  . vitamin C  500 mg Oral Daily  . aspirin EC  81 mg Oral Daily  . baricitinib  4 mg Oral Daily  . diltiazem  60 mg Oral Q6H  . famotidine  20 mg Oral BID  . feeding supplement  237 mL Oral BID BM  . FLUoxetine  40 mg Oral QHS  . guaiFENesin  600 mg Oral BID  . insulin aspart  0-15 Units Subcutaneous TID WC  . insulin aspart  0-5 Units Subcutaneous QHS  . insulin glargine  9 Units Subcutaneous Daily  . Ipratropium-Albuterol  1 puff Inhalation QID  . methylPREDNISolone (SOLU-MEDROL) injection  1 mg/kg Intravenous Q8H  . multivitamin with minerals  1 tablet Oral Daily  .  polyethylene glycol  34 g Oral BID  . zinc sulfate  220 mg Oral Daily   Continuous Infusions:   LOS: 7 days    Time spent: 25 minutes    Sidney Ace, MD [facility] Pager [phone]  If 7PM-7AM, please contact night-coverage 02/11/2020, 2:11 PM

## 2020-02-11 DEATH — deceased

## 2020-02-12 DIAGNOSIS — U071 COVID-19: Secondary | ICD-10-CM | POA: Diagnosis not present

## 2020-02-12 DIAGNOSIS — I4891 Unspecified atrial fibrillation: Secondary | ICD-10-CM | POA: Diagnosis not present

## 2020-02-12 DIAGNOSIS — J9601 Acute respiratory failure with hypoxia: Secondary | ICD-10-CM | POA: Diagnosis not present

## 2020-02-12 LAB — CBC WITH DIFFERENTIAL/PLATELET
Abs Immature Granulocytes: 0.97 10*3/uL — ABNORMAL HIGH (ref 0.00–0.07)
Basophils Absolute: 0.1 10*3/uL (ref 0.0–0.1)
Basophils Relative: 0 %
Eosinophils Absolute: 0 10*3/uL (ref 0.0–0.5)
Eosinophils Relative: 0 %
HCT: 46.5 % (ref 39.0–52.0)
Hemoglobin: 15.3 g/dL (ref 13.0–17.0)
Immature Granulocytes: 5 %
Lymphocytes Relative: 2 %
Lymphs Abs: 0.3 10*3/uL — ABNORMAL LOW (ref 0.7–4.0)
MCH: 31.9 pg (ref 26.0–34.0)
MCHC: 32.9 g/dL (ref 30.0–36.0)
MCV: 97.1 fL (ref 80.0–100.0)
Monocytes Absolute: 1 10*3/uL (ref 0.1–1.0)
Monocytes Relative: 5 %
Neutro Abs: 19.2 10*3/uL — ABNORMAL HIGH (ref 1.7–7.7)
Neutrophils Relative %: 88 %
Platelets: 701 10*3/uL — ABNORMAL HIGH (ref 150–400)
RBC: 4.79 MIL/uL (ref 4.22–5.81)
RDW: 12.4 % (ref 11.5–15.5)
WBC: 21.7 10*3/uL — ABNORMAL HIGH (ref 4.0–10.5)
nRBC: 0 % (ref 0.0–0.2)

## 2020-02-12 LAB — GLUCOSE, CAPILLARY
Glucose-Capillary: 249 mg/dL — ABNORMAL HIGH (ref 70–99)
Glucose-Capillary: 228 mg/dL — ABNORMAL HIGH (ref 70–99)
Glucose-Capillary: 155 mg/dL — ABNORMAL HIGH (ref 70–99)
Glucose-Capillary: 168 mg/dL — ABNORMAL HIGH (ref 70–99)

## 2020-02-12 LAB — BASIC METABOLIC PANEL
Anion gap: 12 (ref 5–15)
BUN: 35 mg/dL — ABNORMAL HIGH (ref 8–23)
CO2: 27 mmol/L (ref 22–32)
Calcium: 8.5 mg/dL — ABNORMAL LOW (ref 8.9–10.3)
Chloride: 98 mmol/L (ref 98–111)
Creatinine, Ser: 1.05 mg/dL (ref 0.61–1.24)
GFR, Estimated: >60 mL/min (ref >60)
Glucose, Bld: 211 mg/dL — ABNORMAL HIGH (ref 70–99)
Potassium: 4.3 mmol/L (ref 3.5–5.1)
Sodium: 137 mmol/L (ref 135–145)

## 2020-02-12 LAB — C-REACTIVE PROTEIN: CRP: 3.4 mg/dL — ABNORMAL HIGH (ref <1.0)

## 2020-02-12 MED ORDER — INSULIN ASPART 100 UNIT/ML ~~LOC~~ SOLN
3.0000 [IU] | Freq: Three times a day (TID) | SUBCUTANEOUS | Status: DC
  Administered 2020-02-12 – 2020-02-21 (×21): 3 [IU] via SUBCUTANEOUS
  Filled 2020-02-12 (×22): qty 1

## 2020-02-12 MED ORDER — ALPRAZOLAM 0.25 MG PO TABS
0.2500 mg | ORAL_TABLET | Freq: Three times a day (TID) | ORAL | Status: DC | PRN
  Administered 2020-02-12 – 2020-02-14 (×5): 0.25 mg via ORAL
  Filled 2020-02-12 (×5): qty 1

## 2020-02-12 MED ORDER — FUROSEMIDE 10 MG/ML IJ SOLN
40.0000 mg | Freq: Once | INTRAMUSCULAR | Status: AC
  Administered 2020-02-12: 40 mg via INTRAVENOUS
  Filled 2020-02-12: qty 4

## 2020-02-12 MED ORDER — METHYLPREDNISOLONE SODIUM SUCC 125 MG IJ SOLR
80.0000 mg | Freq: Two times a day (BID) | INTRAMUSCULAR | Status: DC
  Administered 2020-02-12 – 2020-02-13 (×2): 80 mg via INTRAVENOUS
  Filled 2020-02-12 (×2): qty 2

## 2020-02-12 MED ORDER — AMIODARONE IV BOLUS ONLY 150 MG/100ML
150.0000 mg | Freq: Once | INTRAVENOUS | Status: AC
  Administered 2020-02-12: 150 mg via INTRAVENOUS
  Filled 2020-02-12: qty 100

## 2020-02-12 MED ORDER — INSULIN GLARGINE 100 UNIT/ML ~~LOC~~ SOLN
11.0000 [IU] | Freq: Every day | SUBCUTANEOUS | Status: DC
  Administered 2020-02-13 – 2020-02-19 (×7): 11 [IU] via SUBCUTANEOUS
  Filled 2020-02-12 (×7): qty 0.11

## 2020-02-12 NOTE — Progress Notes (Signed)
Inpatient Diabetes Program Recommendations  AACE/ADA: New Consensus Statement on Inpatient Glycemic Control (2015)  Target Ranges:  Prepandial:   less than 140 mg/dL      Peak postprandial:   less than 180 mg/dL (1-2 hours)      Critically ill patients:  140 - 180 mg/dL   Lab Results  Component Value Date   GLUCAP 249 (H) 02/12/2020   HGBA1C 6.5 (H) 02/08/2020    Review of Glycemic Control  Current orders for Inpatient glycemic control:   Lantus 9 units QD Novolog 0-15 units tidwc and 0-5 units QHS  FBS > 180 mg/dL. May benefit from increasing Levemir and adding small amount of meal coverage while on steroids.  Inpatient Diabetes Program Recommendations:     Increase Lantus to 11 units QD Add Novolog 3 units tidwc while on steroids  Continue to follow and titrate insulin as needed.  Thank you. Alfred Clements, RD, LDN, CDE Inpatient Diabetes Coordinator [phone]

## 2020-02-12 NOTE — Progress Notes (Signed)
PROGRESS NOTE    Alfred Clements  OFB:[id number] DOB: 06-Mar-1961 DOA: 02/06/2020 PCP: Baxter Hire, MD    Brief Narrative:  FernandoGarciais a59 y.o.Hispanic malewith a known history of depression and hypertension, who presented to the emergency room with acute onset of dyspnea and feveras well as dry cough and wheezing since Monday. He admits to fever and chills as well as loss of taste and smell. He has been having fatigue and tiredness. He admitted to diarrhea with bright red bleeding per rectum or melena. No nausea or vomiting.He has been exposed to hisson who wasdiagnosed with COVID-19 a couple days ago. 2 daughters also have similar symptoms. The patient was noted to have hypoxemia with pulseoximetryhas been ranging 88 to 89% on room air. The patient has not been vaccinated for COVID-19. As of 02/11/2020 patient remains in progressive cardiac unit dependent on heated high flow nasal cannula.  He is mentating clearly though he looks fatigued.  Normal work of breathing.  12/2: Patient very anxious and tearful this morning.  Repeatedly request to go home.  Explained to him in Spanish that he was markedly hypoxic and that discharge home at this time would not be safe in any way.  He expressed understanding.   Assessment & Plan:   Active Problems:   Atypical pneumonia  Multifocal pneumonia secondary to COVID-19 Severe acute hypoxic respiratory failure secondary to above Oxygen saturation on admission 86% Oxygen requirement has gone up substantially since then Completed course of remdesivir in house Now dependent on heated high flow nasal cannula 50 L at 60% FiO2 Plan: Continue Solu-Medrol, dose decreased to 80 mg every 12 hours Follow daily inflammatory markers Continue baricitinib Stress I-S and flutter use Out of bed to chair as tolerated Combivent 4 times daily Wean oxygen as tolerated  Acute anxiety 12-21, patient very tearful and anxious Wishes to go  home Requests medication for antianxiety Plan: Xanax 25 mg p.o. 3 times daily as needed Frequent reassurance of patient  Atrial fibrillation with rapid ventricular response No documented history of arrhythmia Development possibly due to Covid infection and hypoxia Cardiology following Rate remains uncontrolled Plan: Amiodarone 400 mg p.o. twice daily Diltiazem 60 mg every 6 hours Eliquis for anticoagulation Telemetry monitoring Cardiology follow-up  Hypokalemia Monitor and replace as needed Maintain K greater than 4, mag greater than 2  Hyponatremia, resolved Monitor  Essential hypertension Not taking any home medications Continue continue diltiazem as above  Depression Continue home Prozac  GERD Pepcid  Hyperglycemia No history of DM Likely due to steroids Continue Accu-Cheks and sliding scale insulin  DVT prophylaxis: Lovenox Code Status: Full Family Communication: Wife Alfred Clements [phone] on 02/11/2020 Disposition Plan: Status is: Inpatient  Remains inpatient appropriate because:Inpatient level of care appropriate due to severity of illness   Dispo: The patient is from: Home              Anticipated d/c is to: Home              Anticipated d/c date is: > 3 days              Patient currently is not medically stable to d/c.   Still severely hypoxic requiring heated high flow nasal cannula.  Disposition plan pending.  Anticipate several days in the hospital.      Consultants:   Cardiology-[facility]  Procedures:   None  Antimicrobials:   None   Subjective: Patient seen and examined.  Very tearful this morning.  Endorses anxiety.  Wishes to go home.  Objective: Vitals:   02/12/20 0840 02/12/20 1107 02/12/20 1212 02/12/20 1254  BP: 102/81  104/81 105/81  Pulse: 63  67 (!) 138  Resp: 19  19   Temp: 98.1 F (36.7 C)  98.3 F (36.8 C)   TempSrc:      SpO2: 92% 91% 97%   Weight:      Height:        Intake/Output Summary  (Last 24 hours) at 02/12/2020 1300 Last data filed at 02/12/2020 0603 Gross per 24 hour  Intake --  Output 800 ml  Net -800 ml   Filed Weights   02/09/20 0444 02/11/20 0606 02/12/20 0602  Weight: 91.7 kg 90.7 kg 90 kg    Examination:  General exam: Tearful and anxious Respiratory system: Coarse crackles bilaterally.  Normal work of breathing.  50 L  cardiovascular system: Tachycardic, irregular rhythm, no appreciable murmurs, no pedal edema  gastrointestinal system: Abdomen is nondistended, soft and nontender. No organomegaly or masses felt. Normal bowel sounds heard. Central nervous system: Alert and oriented. No focal neurological deficits. Extremities: Symmetric 5 x 5 power. Skin: No rashes, lesions or ulcers Psychiatry: Judgement and insight appear impaired. Mood & affect anxious.     Data Reviewed: I have personally reviewed following labs and imaging studies  CBC: Recent Labs  Lab 02/08/20 0544 02/09/20 0551 02/10/20 0400 02/11/20 0426 02/12/20 0733  WBC 15.3* 17.4* 17.3* 19.6* 21.7*  NEUTROABS  --   --   --   --  19.2*  HGB 16.0 15.1 15.3 15.6 15.3  HCT 47.1 45.7 45.4 48.1 46.5  MCV 94.8 96.6 95.8 97.8 97.1  PLT 409* 442* 482* 607* 324*   Basic Metabolic Panel: Recent Labs  Lab 02/07/20 0406 02/07/20 0406 02/08/20 0544 02/09/20 0551 02/10/20 0400 02/11/20 0426 02/12/20 0733  NA 137   < > 137 140 138 137 137  K 4.1   < > 3.9 4.3 4.4 4.4 4.3  CL 98   < > 100 102 99 98 98  CO2 26   < > 27 27 27 28 27   GLUCOSE 203*   < > 210* 215* 203* 265* 211*  BUN 37*   < > 34* 39* 40* 39* 35*  CREATININE 1.32*   < > 1.14 1.23 1.16 1.30* 1.05  CALCIUM 8.6*   < > 8.6* 8.5* 8.7* 8.6* 8.5*  MG 3.0*  --  3.0* 3.1* 3.2* 2.7*  --    < > = values in this interval not displayed.   GFR: Estimated Creatinine Clearance: 82.7 mL/min (by C-G formula based on SCr of 1.05 mg/dL). Liver Function Tests: No results for input(s): AST, ALT, ALKPHOS, BILITOT, PROT, ALBUMIN in the last  168 hours. No results for input(s): LIPASE, AMYLASE in the last 168 hours. No results for input(s): AMMONIA in the last 168 hours. Coagulation Profile: No results for input(s): INR, PROTIME in the last 168 hours. Cardiac Enzymes: No results for input(s): CKTOTAL, CKMB, CKMBINDEX, TROPONINI in the last 168 hours. BNP (last 3 results) No results for input(s): PROBNP in the last 8760 hours. HbA1C: No results for input(s): HGBA1C in the last 72 hours. CBG: Recent Labs  Lab 02/11/20 1208 02/11/20 1650 02/11/20 2039 02/12/20 0842 02/12/20 1212  GLUCAP 201* 176* 207* 249* 228*   Lipid Profile: No results for input(s): CHOL, HDL, LDLCALC, TRIG, CHOLHDL, LDLDIRECT in the last 72 hours. Thyroid Function Tests: No results for input(s): TSH, T4TOTAL, FREET4, T3FREE, THYROIDAB in the last  72 hours. Anemia Panel: No results for input(s): VITAMINB12, FOLATE, FERRITIN, TIBC, IRON, RETICCTPCT in the last 72 hours. Sepsis Labs: No results for input(s): PROCALCITON, LATICACIDVEN in the last 168 hours.  Recent Results (from the past 240 hour(s))  Resp Panel by RT-PCR (Flu A&B, Covid) Nasopharyngeal Swab     Status: Abnormal   Collection Time: 02/03/2020  8:59 PM   Specimen: Nasopharyngeal Swab; Nasopharyngeal(NP) swabs in vial transport medium  Result Value Ref Range Status   SARS Coronavirus 2 by RT PCR POSITIVE (A) NEGATIVE Final    Comment: RESULT CALLED TO, READ BACK BY AND VERIFIED WITH: MCKENZIE WALLACE @2333  ON 01/14/2020 [facility] (NOTE) SARS-CoV-2 target nucleic acids are DETECTED.  The SARS-CoV-2 RNA is generally detectable in upper respiratory specimens during the acute phase of infection. Positive results are indicative of the presence of the identified virus, but do not rule out bacterial infection or co-infection with other pathogens not detected by the test. Clinical correlation with patient history and other diagnostic information is necessary to determine patient infection status.  The expected result is Negative.  Fact Sheet for Patients: EntrepreneurPulse.com.au  Fact Sheet for Healthcare Providers: IncredibleEmployment.be  This test is not yet approved or cleared by the [place] FDA and  has been authorized for detection and/or diagnosis of SARS-CoV-2 by FDA under an Emergency Use Authorization (EUA).  This EUA will remain in effect (meaning this test c an be used) for the duration of  the COVID-19 declaration under Section 564(b)(1) of the Act, 21 U.S.C. section 360bbb-3(b)(1), unless the authorization is terminated or revoked sooner.     Influenza A by PCR NEGATIVE NEGATIVE Final   Influenza B by PCR NEGATIVE NEGATIVE Final    Comment: (NOTE) The Xpert Xpress SARS-CoV-2/FLU/RSV plus assay is intended as an aid in the diagnosis of influenza from Nasopharyngeal swab specimens and should not be used as a sole basis for treatment. Nasal washings and aspirates are unacceptable for Xpert Xpress SARS-CoV-2/FLU/RSV testing.  Fact Sheet for Patients: EntrepreneurPulse.com.au  Fact Sheet for Healthcare Providers: IncredibleEmployment.be  This test is not yet approved or cleared by the [place] FDA and has been authorized for detection and/or diagnosis of SARS-CoV-2 by FDA under an Emergency Use Authorization (EUA). This EUA will remain in effect (meaning this test can be used) for the duration of the COVID-19 declaration under Section 564(b)(1) of the Act, 21 U.S.C. section 360bbb-3(b)(1), unless the authorization is terminated or revoked.  Performed at [facility], [address] [place], [place] [address]          Radiology Studies: No results found.      Scheduled Meds: . amiodarone  400 mg Oral BID  . apixaban  5 mg Oral BID  . vitamin C  500 mg Oral Daily  . aspirin EC  81 mg Oral Daily  . baricitinib  4 mg Oral Daily  . diltiazem  60 mg Oral  Q6H  . famotidine  20 mg Oral BID  . feeding supplement  237 mL Oral BID BM  . FLUoxetine  40 mg Oral QHS  . guaiFENesin  600 mg Oral BID  . insulin aspart  0-15 Units Subcutaneous TID WC  . insulin aspart  0-5 Units Subcutaneous QHS  . insulin aspart  3 Units Subcutaneous TID WC  . [START ON 02/13/2020] insulin glargine  11 Units Subcutaneous Daily  . Ipratropium-Albuterol  1 puff Inhalation QID  . methylPREDNISolone (SOLU-MEDROL) injection  80 mg Intravenous Q12H  . multivitamin  with minerals  1 tablet Oral Daily  . polyethylene glycol  34 g Oral BID  . zinc sulfate  220 mg Oral Daily   Continuous Infusions: . amiodarone 150 mg (02/12/20 1251)     LOS: 8 days    Time spent: 25 minutes    Sidney Ace, MD [facility] Pager [phone]  If 7PM-7AM, please contact night-coverage 02/12/2020, 1:00 PM

## 2020-02-12 NOTE — Plan of Care (Signed)
Patient able to maintain O2 sats on HFNC @ 15 L this shift, heart rate still labile/irregular. Patient abel to eat 50% of meals.  Problem: Clinical Measurements: Goal: Ability to maintain clinical measurements within normal limits will improve Outcome: Progressing Goal: Respiratory complications will improve Outcome: Progressing   Problem: Activity: Goal: Risk for activity intolerance will decrease Outcome: Not Progressing   Problem: Coping: Goal: Level of anxiety will decrease Outcome: Progressing   Problem: Respiratory: Goal: Will maintain a patent airway Outcome: Progressing Goal: Complications related to the disease process, condition or treatment will be avoided or minimized Outcome: Progressing

## 2020-02-13 DIAGNOSIS — U071 COVID-19: Secondary | ICD-10-CM | POA: Diagnosis not present

## 2020-02-13 DIAGNOSIS — J9601 Acute respiratory failure with hypoxia: Secondary | ICD-10-CM | POA: Diagnosis not present

## 2020-02-13 DIAGNOSIS — I4891 Unspecified atrial fibrillation: Secondary | ICD-10-CM | POA: Diagnosis not present

## 2020-02-13 LAB — BASIC METABOLIC PANEL
Anion gap: 11 (ref 5–15)
BUN: 40 mg/dL — ABNORMAL HIGH (ref 8–23)
CO2: 32 mmol/L (ref 22–32)
Calcium: 8.6 mg/dL — ABNORMAL LOW (ref 8.9–10.3)
Chloride: 96 mmol/L — ABNORMAL LOW (ref 98–111)
Creatinine, Ser: 1.18 mg/dL (ref 0.61–1.24)
GFR, Estimated: >60 mL/min (ref >60)
Glucose, Bld: 159 mg/dL — ABNORMAL HIGH (ref 70–99)
Potassium: 4.4 mmol/L (ref 3.5–5.1)
Sodium: 139 mmol/L (ref 135–145)

## 2020-02-13 LAB — CBC WITH DIFFERENTIAL/PLATELET
Abs Immature Granulocytes: 0.61 10*3/uL — ABNORMAL HIGH (ref 0.00–0.07)
Basophils Absolute: 0.1 10*3/uL (ref 0.0–0.1)
Basophils Relative: 0 %
Eosinophils Absolute: 0 10*3/uL (ref 0.0–0.5)
Eosinophils Relative: 0 %
HCT: 46.5 % (ref 39.0–52.0)
Hemoglobin: 15.1 g/dL (ref 13.0–17.0)
Immature Granulocytes: 3 %
Lymphocytes Relative: 2 %
Lymphs Abs: 0.5 10*3/uL — ABNORMAL LOW (ref 0.7–4.0)
MCH: 32.2 pg (ref 26.0–34.0)
MCHC: 32.5 g/dL (ref 30.0–36.0)
MCV: 99.1 fL (ref 80.0–100.0)
Monocytes Absolute: 0.9 10*3/uL (ref 0.1–1.0)
Monocytes Relative: 5 %
Neutro Abs: 16.8 10*3/uL — ABNORMAL HIGH (ref 1.7–7.7)
Neutrophils Relative %: 90 %
Platelets: 653 10*3/uL — ABNORMAL HIGH (ref 150–400)
RBC: 4.69 MIL/uL (ref 4.22–5.81)
RDW: 12.8 % (ref 11.5–15.5)
WBC: 18.8 10*3/uL — ABNORMAL HIGH (ref 4.0–10.5)
nRBC: 0 % (ref 0.0–0.2)

## 2020-02-13 LAB — GLUCOSE, CAPILLARY
Glucose-Capillary: 163 mg/dL — ABNORMAL HIGH (ref 70–99)
Glucose-Capillary: 275 mg/dL — ABNORMAL HIGH (ref 70–99)
Glucose-Capillary: 142 mg/dL — ABNORMAL HIGH (ref 70–99)
Glucose-Capillary: 113 mg/dL — ABNORMAL HIGH (ref 70–99)

## 2020-02-13 LAB — C-REACTIVE PROTEIN: CRP: 1.8 mg/dL — ABNORMAL HIGH (ref <1.0)

## 2020-02-13 MED ORDER — METHYLPREDNISOLONE SODIUM SUCC 125 MG IJ SOLR
60.0000 mg | Freq: Two times a day (BID) | INTRAMUSCULAR | Status: DC
  Administered 2020-02-13 – 2020-02-15 (×4): 60 mg via INTRAVENOUS
  Filled 2020-02-13 (×4): qty 2

## 2020-02-13 NOTE — Progress Notes (Signed)
Patient Name: Alfred Clements Date of Encounter: 02/13/2020  Hospital Problem List     Active Problems:   Atypical pneumonia    Patient Profile     59 y.o.Alfred Clements history ofhypertension who presented to the emergency room with shortness of breath cough and wheezing. He had exposure to COVID-19 positive patient in his home. He was not vaccinated. He was COVID-19 PCR positive and influenza negative. Chest x-ray revealed atypical pneumonia. EKG showed sinus rhythm with a rate of 95 PACs. He was treated with remdesivir and Solu-Medrol. He was continued with baricitinib.He developed atrial fibrillation with rapid ventricular response. He was placed on amiodarone IV.Started on apixaban 5 mg twice daily. His CHA2DS2-VASc score is 0. Has been difficult to control his ventricular rate.   Subjective     Inpatient Medications     amiodarone  400 mg Oral BID   apixaban  5 mg Oral BID   vitamin C  500 mg Oral Daily   aspirin EC  81 mg Oral Daily   baricitinib  4 mg Oral Daily   diltiazem  60 mg Oral Q6H   famotidine  20 mg Oral BID   feeding supplement  237 mL Oral BID BM   FLUoxetine  40 mg Oral QHS   guaiFENesin  600 mg Oral BID   insulin aspart  0-15 Units Subcutaneous TID WC   insulin aspart  0-5 Units Subcutaneous QHS   insulin aspart  3 Units Subcutaneous TID WC   insulin glargine  11 Units Subcutaneous Daily   Ipratropium-Albuterol  1 puff Inhalation QID   methylPREDNISolone (SOLU-MEDROL) injection  60 mg Intravenous Q12H   multivitamin with minerals  1 tablet Oral Daily   polyethylene glycol  34 g Oral BID   zinc sulfate  220 mg Oral Daily    Vital Signs    Vitals:   02/13/20 0045 02/13/20 0058 02/13/20 0522 02/13/20 0625  BP:   124/78   Pulse: 78 68 72 (!) 48  Resp: 18 15 20 17   Temp:   97.9 F (36.6 C)   TempSrc:   Oral   SpO2: 92% 93% 90% 97%  Weight:   88.2 kg   Height:        Intake/Output Summary (Last 24 hours) at  02/13/2020 0831 Last data filed at 02/13/2020 0000 Gross per 24 hour  Intake --  Output 200 ml  Net -200 ml   Filed Weights   02/11/20 0606 02/12/20 0602 02/13/20 0522  Weight: 90.7 kg 90 kg 88.2 kg    Physical Exam      Labs    CBC Recent Labs    02/12/20 0733 02/13/20 0428  WBC 21.7* 18.8*  NEUTROABS 19.2* 16.8*  HGB 15.3 15.1  HCT 46.5 46.5  MCV 97.1 99.1  PLT 701* 993*   Basic Metabolic Panel Recent Labs    02/11/20 0426 02/11/20 0426 02/12/20 0733 02/13/20 0428  NA 137   < > 137 139  K 4.4   < > 4.3 4.4  CL 98   < > 98 96*  CO2 28   < > 27 32  GLUCOSE 265*   < > 211* 159*  BUN 39*   < > 35* 40*  CREATININE 1.30*   < > 1.05 1.18  CALCIUM 8.6*   < > 8.5* 8.6*  MG 2.7*  --   --   --    < > = values in this interval not displayed.   Liver Function Tests  No results for input(s): AST, ALT, ALKPHOS, BILITOT, PROT, ALBUMIN in the last 72 hours. No results for input(s): LIPASE, AMYLASE in the last 72 hours. Cardiac Enzymes No results for input(s): CKTOTAL, CKMB, CKMBINDEX, TROPONINI in the last 72 hours. BNP No results for input(s): BNP in the last 72 hours. D-Dimer No results for input(s): DDIMER in the last 72 hours. Hemoglobin A1C No results for input(s): HGBA1C in the last 72 hours. Fasting Lipid Panel No results for input(s): CHOL, HDL, LDLCALC, TRIG, CHOLHDL, LDLDIRECT in the last 72 hours. Thyroid Function Tests No results for input(s): TSH, T4TOTAL, T3FREE, THYROIDAB in the last 72 hours.  Invalid input(s): FREET3  Telemetry    afib with variable but fiarly rapid vr . 120 this am  ECG    afib with rvr  Radiology    DG Chest 2 View  Result Date: 02/03/2020 CLINICAL DATA:  59 year old male with shortness of breath. EXAM: CHEST - 2 VIEW COMPARISON:  None FINDINGS: Bilateral streaky pulmonary densities may represent edema but concerning for atypical infection. Clinical correlation is recommended. No focal consolidation, pleural effusion,  pneumothorax. The cardiac silhouette is within limits. No acute osseous pathology. IMPRESSION: Findings may represent edema but concerning for atypical infection. Clinical correlation is recommended. Electronically Signed   By: Anner Crete M.D.   On: 01/30/2020 19:26    Assessment & Plan    59 y.o.Alfred Clements history ofhypertension who presented to the emergency room with shortness of breath cough and wheezing. He was noted to have fever chills with loss of taste and smell. He had exposure to COVID-19 positive patient in his home. He was not vaccinated. He was COVID-19 PCR positive and influenza negative. Chest x-ray revealed atypical pneumonia. EKG showed sinus rhythm with a rate of 95 PACs. He was treated with remdesivir and Solu-Medrol. He was continued with baricitinib.He developed atrial fibrillation with rapid ventricular response. He was placed on amiodarone IV as well as IV diltiazem. Started on apixaban 5 mg twice daily. His CHA2DS2-VASc score is 0. He is less than 62 now hypertension or diabetes.   1. COVID-19-currently on aggressive therapy.  2. Atrial fibrillation-developed Alfred Clements after presentation. Unclear whether he had a preceding history of this. Currently on amiodarone at maintenance IV at 30 mg at present.Will attempt to convert to po amiodarone. 200 bidStill in Alfred Clements withimprovedventricular response although does have some periods of bradycardia improves to the low 1 teens. Hemodynamics are somewhat variable. Likely this is being driven by his increased work of breathing. WillcontinueCardizem p.o. 60 every 6 following hemodynamics. Wouldcontinue todefer cardioversion for now given airspace disease and hypoxia. Maintaining nsr is unlikely. Continue with Eliquis.rate is slightly improved more often at present.    Signed, Javier Docker Tilla Wilborn MD 02/13/2020, 8:31 AM  Pager: [phone]   Dr. Clayborn Bigness will be covering my patients this  weekend.

## 2020-02-13 NOTE — Progress Notes (Signed)
PROGRESS NOTE    Alfred Clements  RJJ:[id number] DOB: 21-Mar-1960 DOA: 02/05/2020 PCP: Baxter Hire, MD    Brief Narrative:  FernandoGarciais a59 y.o.Hispanic malewith a known history of depression and hypertension, who presented to the emergency room with acute onset of dyspnea and feveras well as dry cough and wheezing since Monday. He admits to fever and chills as well as loss of taste and smell. He has been having fatigue and tiredness. He admitted to diarrhea with bright red bleeding per rectum or melena. No nausea or vomiting.He has been exposed to hisson who wasdiagnosed with COVID-19 a couple days ago. 2 daughters also have similar symptoms. The patient was noted to have hypoxemia with pulseoximetryhas been ranging 88 to 89% on room air. The patient has not been vaccinated for COVID-19. As of 02/11/2020 patient remains in progressive cardiac unit dependent on heated high flow nasal cannula.  He is mentating clearly though he looks fatigued.  Normal work of breathing.  12/2: Patient very anxious and tearful this morning.  Repeatedly request to go home.  Explained to him in Spanish that he was markedly hypoxic and that discharge home at this time would not be safe in any way.  He expressed understanding. 12/3: Oxygen status improved over interval.  Wean from heated high flow nasal cannula to high flow nasal cannula at 15 L.  Still anxious but less.  Remains in rapid atrial fibrillation   Assessment & Plan:   Active Problems:   Atypical pneumonia  Multifocal pneumonia secondary to COVID-19 Severe acute hypoxic respiratory failure secondary to above Oxygen saturation on admission 86% Oxygen requirement has gone up substantially since then Completed course of remdesivir in house Weaned off heated high flow nasal cannula Now at 15 L high flow Plan: Continue Solu-Medrol, dose decreased to 60 mg every 12 hours Follow daily inflammatory markers Continue  baricitinib Stress I-S and flutter use Out of bed to chair as tolerated Combivent 4 times daily Wean oxygen as tolerated  Acute anxiety 02/12/20 patient very tearful and anxious Wishes to go home Requests medication for antianxiety Appears mildly improved over interval Plan: Xanax 25 mg p.o. 3 times daily as needed Frequent reassurance of patient  Atrial fibrillation with rapid ventricular response No documented history of arrhythmia Development possibly due to Covid infection and hypoxia Cardiology following Rate remains uncontrolled Plan: Amiodarone 400 mg p.o. twice daily Diltiazem 60 mg every 6 hours Eliquis for anticoagulation Telemetry monitoring Cardiology follow-up  Hypokalemia Monitor and replace as needed Maintain K greater than 4, mag greater than 2  Hyponatremia, resolved Monitor  Essential hypertension Not taking any home medications Continue continue diltiazem as above  Depression Continue home Prozac  GERD Pepcid  Hyperglycemia No history of DM Likely due to steroids Continue Accu-Cheks and sliding scale insulin  DVT prophylaxis: Lovenox Code Status: Full Family Communication: Wife Alfred Clements [phone] on 02/13/2020 Disposition Plan: Status is: Inpatient  Remains inpatient appropriate because:Inpatient level of care appropriate due to severity of illness   Dispo: The patient is from: Home              Anticipated d/c is to: Home              Anticipated d/c date is: 3 days              Patient currently is not medically stable to d/c.   Now weaned to 15 L high flow nasal cannula.  Not medically stable for discharge at this time.  Consultants:   Cardiology-[facility]  Procedures:   None  Antimicrobials:   None   Subjective: Patient seen and examined.  Still anxious but less than yesterday.  Again requesting to go home.  Objective: Vitals:   02/13/20 0625 02/13/20 0905 02/13/20 1156 02/13/20 1218  BP:  109/88  112/83   Pulse: (!) 48 82 (!) 124 (!) 113  Resp: 17 19 (!) 27 19  Temp:  97.9 F (36.6 C) 98 F (36.7 C)   TempSrc:  Oral Oral   SpO2: 97% 94% 92%   Weight:      Height:        Intake/Output Summary (Last 24 hours) at 02/13/2020 1236 Last data filed at 02/13/2020 1157 Gross per 24 hour  Intake --  Output 575 ml  Net -575 ml   Filed Weights   02/11/20 0606 02/12/20 0602 02/13/20 0522  Weight: 90.7 kg 90 kg 88.2 kg    Examination:  General exam: Tearful and anxious Respiratory system: Scattered crackles bilaterally.  Normal work of breathing.  15 L cardiovascular system: Tachycardic, irregular rhythm, no appreciable murmurs, no pedal edema  gastrointestinal system: Abdomen is nondistended, soft and nontender. No organomegaly or masses felt. Normal bowel sounds heard. Central nervous system: Alert and oriented. No focal neurological deficits. Extremities: Symmetric 5 x 5 power. Skin: No rashes, lesions or ulcers Psychiatry: Judgement and insight appear impaired. Mood & affect anxious.     Data Reviewed: I have personally reviewed following labs and imaging studies  CBC: Recent Labs  Lab 02/09/20 0551 02/10/20 0400 02/11/20 0426 02/12/20 0733 02/13/20 0428  WBC 17.4* 17.3* 19.6* 21.7* 18.8*  NEUTROABS  --   --   --  19.2* 16.8*  HGB 15.1 15.3 15.6 15.3 15.1  HCT 45.7 45.4 48.1 46.5 46.5  MCV 96.6 95.8 97.8 97.1 99.1  PLT 442* 482* 607* 701* 710*   Basic Metabolic Panel: Recent Labs  Lab 02/07/20 0406 02/07/20 0406 02/08/20 0544 02/08/20 0544 02/09/20 0551 02/10/20 0400 02/11/20 0426 02/12/20 0733 02/13/20 0428  NA 137   < > 137   < > 140 138 137 137 139  K 4.1   < > 3.9   < > 4.3 4.4 4.4 4.3 4.4  CL 98   < > 100   < > 102 99 98 98 96*  CO2 26   < > 27   < > 27 27 28 27  32  GLUCOSE 203*   < > 210*   < > 215* 203* 265* 211* 159*  BUN 37*   < > 34*   < > 39* 40* 39* 35* 40*  CREATININE 1.32*   < > 1.14   < > 1.23 1.16 1.30* 1.05 1.18  CALCIUM 8.6*   < >  8.6*   < > 8.5* 8.7* 8.6* 8.5* 8.6*  MG 3.0*  --  3.0*  --  3.1* 3.2* 2.7*  --   --    < > = values in this interval not displayed.   GFR: Estimated Creatinine Clearance: 72.9 mL/min (by C-G formula based on SCr of 1.18 mg/dL). Liver Function Tests: No results for input(s): AST, ALT, ALKPHOS, BILITOT, PROT, ALBUMIN in the last 168 hours. No results for input(s): LIPASE, AMYLASE in the last 168 hours. No results for input(s): AMMONIA in the last 168 hours. Coagulation Profile: No results for input(s): INR, PROTIME in the last 168 hours. Cardiac Enzymes: No results for input(s): CKTOTAL, CKMB, CKMBINDEX, TROPONINI in the last 168 hours.  BNP (last 3 results) No results for input(s): PROBNP in the last 8760 hours. HbA1C: No results for input(s): HGBA1C in the last 72 hours. CBG: Recent Labs  Lab 02/12/20 1212 02/12/20 1722 02/12/20 2019 02/13/20 0902 02/13/20 1154  GLUCAP 228* 155* 168* 163* 275*   Lipid Profile: No results for input(s): CHOL, HDL, LDLCALC, TRIG, CHOLHDL, LDLDIRECT in the last 72 hours. Thyroid Function Tests: No results for input(s): TSH, T4TOTAL, FREET4, T3FREE, THYROIDAB in the last 72 hours. Anemia Panel: No results for input(s): VITAMINB12, FOLATE, FERRITIN, TIBC, IRON, RETICCTPCT in the last 72 hours. Sepsis Labs: No results for input(s): PROCALCITON, LATICACIDVEN in the last 168 hours.  Recent Results (from the past 240 hour(s))  Resp Panel by RT-PCR (Flu A&B, Covid) Nasopharyngeal Swab     Status: Abnormal   Collection Time: 01/31/2020  8:59 PM   Specimen: Nasopharyngeal Swab; Nasopharyngeal(NP) swabs in vial transport medium  Result Value Ref Range Status   SARS Coronavirus 2 by RT PCR POSITIVE (A) NEGATIVE Final    Comment: RESULT CALLED TO, READ BACK BY AND VERIFIED WITH: MCKENZIE WALLACE @2333  ON 01/30/2020 SK[facility] (NOTE) SARS-CoV-2 target nucleic acids are DETECTED.  The SARS-CoV-2 RNA is generally detectable in upper respiratory specimens during the  acute phase of infection. Positive results are indicative of the presence of the identified virus, but do not rule out bacterial infection or co-infection with other pathogens not detected by the test. Clinical correlation with patient history and other diagnostic information is necessary to determine patient infection status. The expected result is Negative.  Fact Sheet for Patients: EntrepreneurPulse.com.au  Fact Sheet for Healthcare Providers: IncredibleEmployment.be  This test is not yet approved or cleared by the [place] FDA and  has been authorized for detection and/or diagnosis of SARS-CoV-2 by FDA under an Emergency Use Authorization (EUA).  This EUA will remain in effect (meaning this test c an be used) for the duration of  the COVID-19 declaration under Section 564(b)(1) of the Act, 21 U.S.C. section 360bbb-3(b)(1), unless the authorization is terminated or revoked sooner.     Influenza A by PCR NEGATIVE NEGATIVE Final   Influenza B by PCR NEGATIVE NEGATIVE Final    Comment: (NOTE) The Xpert Xpress SARS-CoV-2/FLU/RSV plus assay is intended as an aid in the diagnosis of influenza from Nasopharyngeal swab specimens and should not be used as a sole basis for treatment. Nasal washings and aspirates are unacceptable for Xpert Xpress SARS-CoV-2/FLU/RSV testing.  Fact Sheet for Patients: EntrepreneurPulse.com.au  Fact Sheet for Healthcare Providers: IncredibleEmployment.be  This test is not yet approved or cleared by the [place] FDA and has been authorized for detection and/or diagnosis of SARS-CoV-2 by FDA under an Emergency Use Authorization (EUA). This EUA will remain in effect (meaning this test can be used) for the duration of the COVID-19 declaration under Section 564(b)(1) of the Act, 21 U.S.C. section 360bbb-3(b)(1), unless the authorization is terminated or revoked.  Performed at  [facility], [address], [place], [place] [address]          Radiology Studies: No results found.      Scheduled Meds: . amiodarone  400 mg Oral BID  . apixaban  5 mg Oral BID  . vitamin C  500 mg Oral Daily  . aspirin EC  81 mg Oral Daily  . baricitinib  4 mg Oral Daily  . diltiazem  60 mg Oral Q6H  . famotidine  20 mg Oral BID  . feeding supplement  237 mL  Oral BID BM  . FLUoxetine  40 mg Oral QHS  . guaiFENesin  600 mg Oral BID  . insulin aspart  0-15 Units Subcutaneous TID WC  . insulin aspart  0-5 Units Subcutaneous QHS  . insulin aspart  3 Units Subcutaneous TID WC  . insulin glargine  11 Units Subcutaneous Daily  . Ipratropium-Albuterol  1 puff Inhalation QID  . methylPREDNISolone (SOLU-MEDROL) injection  60 mg Intravenous Q12H  . multivitamin with minerals  1 tablet Oral Daily  . polyethylene glycol  34 g Oral BID  . zinc sulfate  220 mg Oral Daily   Continuous Infusions:    LOS: 9 days    Time spent: 25 minutes    Sidney Ace, MD [facility] Pager [phone]  If 7PM-7AM, please contact night-coverage 02/13/2020, 12:36 PM

## 2020-02-14 DIAGNOSIS — U071 COVID-19: Secondary | ICD-10-CM | POA: Diagnosis not present

## 2020-02-14 DIAGNOSIS — I4891 Unspecified atrial fibrillation: Secondary | ICD-10-CM | POA: Diagnosis not present

## 2020-02-14 DIAGNOSIS — J9601 Acute respiratory failure with hypoxia: Secondary | ICD-10-CM | POA: Diagnosis not present

## 2020-02-14 LAB — CBC WITH DIFFERENTIAL/PLATELET
Abs Immature Granulocytes: 0.5 10*3/uL — ABNORMAL HIGH (ref 0.00–0.07)
Basophils Absolute: 0.1 10*3/uL (ref 0.0–0.1)
Basophils Relative: 0 %
Eosinophils Absolute: 0 10*3/uL (ref 0.0–0.5)
Eosinophils Relative: 0 %
HCT: 49 % (ref 39.0–52.0)
Hemoglobin: 16 g/dL (ref 13.0–17.0)
Immature Granulocytes: 2 %
Lymphocytes Relative: 1 %
Lymphs Abs: 0.3 10*3/uL — ABNORMAL LOW (ref 0.7–4.0)
MCH: 31.9 pg (ref 26.0–34.0)
MCHC: 32.7 g/dL (ref 30.0–36.0)
MCV: 97.6 fL (ref 80.0–100.0)
Monocytes Absolute: 1.2 10*3/uL — ABNORMAL HIGH (ref 0.1–1.0)
Monocytes Relative: 4 %
Neutro Abs: 24.9 10*3/uL — ABNORMAL HIGH (ref 1.7–7.7)
Neutrophils Relative %: 93 %
Platelets: 646 10*3/uL — ABNORMAL HIGH (ref 150–400)
RBC: 5.02 MIL/uL (ref 4.22–5.81)
RDW: 12.7 % (ref 11.5–15.5)
Smear Review: NORMAL
WBC: 27 10*3/uL — ABNORMAL HIGH (ref 4.0–10.5)
nRBC: 0 % (ref 0.0–0.2)

## 2020-02-14 LAB — GLUCOSE, CAPILLARY
Glucose-Capillary: 175 mg/dL — ABNORMAL HIGH (ref 70–99)
Glucose-Capillary: 167 mg/dL — ABNORMAL HIGH (ref 70–99)
Glucose-Capillary: 157 mg/dL — ABNORMAL HIGH (ref 70–99)
Glucose-Capillary: 103 mg/dL — ABNORMAL HIGH (ref 70–99)

## 2020-02-14 LAB — BASIC METABOLIC PANEL
Anion gap: 14 (ref 5–15)
BUN: 43 mg/dL — ABNORMAL HIGH (ref 8–23)
CO2: 30 mmol/L (ref 22–32)
Calcium: 8.5 mg/dL — ABNORMAL LOW (ref 8.9–10.3)
Chloride: 96 mmol/L — ABNORMAL LOW (ref 98–111)
Creatinine, Ser: 1.19 mg/dL (ref 0.61–1.24)
GFR, Estimated: >60 mL/min (ref >60)
Glucose, Bld: 176 mg/dL — ABNORMAL HIGH (ref 70–99)
Potassium: 4.5 mmol/L (ref 3.5–5.1)
Sodium: 140 mmol/L (ref 135–145)

## 2020-02-14 LAB — C-REACTIVE PROTEIN: CRP: 2 mg/dL — ABNORMAL HIGH (ref <1.0)

## 2020-02-14 MED ORDER — BLISTEX MEDICATED EX OINT
1.0000 "application " | TOPICAL_OINTMENT | CUTANEOUS | Status: DC | PRN
  Filled 2020-02-14: qty 6.3

## 2020-02-14 NOTE — Progress Notes (Signed)
   02/13/20 2015  Assess: MEWS Score  Temp (!) 97.5 F (36.4 C)  BP (!) 122/96  Pulse Rate (!) 124  Resp (!) 22  SpO2 93 %  O2 Device HFNC  O2 Flow Rate (L/min) 11 L/min  Assess: MEWS Score  MEWS Temp 0  MEWS Systolic 0  MEWS Pulse 2  MEWS RR 1  MEWS LOC 0  MEWS Score 3  MEWS Score Color Yellow  Assess: if the MEWS score is Yellow or Red  Were vital signs taken at a resting state? Yes  Focused Assessment No change from prior assessment  Early Detection of Sepsis Score *See Row Information* High  MEWS guidelines implemented *See Row Information* No, previously yellow, continue vital signs every 4 hours

## 2020-02-14 NOTE — Progress Notes (Signed)
   02/14/20 0300  Assess: MEWS Score  Temp 97.6 F (36.4 C)  BP (!) 142/123  Pulse Rate 80  ECG Heart Rate (!) 118  Resp 20  SpO2 90 %  O2 Device Nasal Cannula;HFNC  O2 Flow Rate (L/min) 11 L/min  Assess: MEWS Score  MEWS Temp 0  MEWS Systolic 0  MEWS Pulse 2  MEWS RR 0  MEWS LOC 0  MEWS Score 2  MEWS Score Color Yellow  Assess: if the MEWS score is Yellow or Red  Were vital signs taken at a resting state? Yes  Focused Assessment No change from prior assessment  Early Detection of Sepsis Score *See Row Information* High  MEWS guidelines implemented *See Row Information* No, previously yellow, continue vital signs every 4 hours

## 2020-02-14 NOTE — Progress Notes (Signed)
   02/13/20 2335  Assess: MEWS Score  Temp 97.6 F (36.4 C)  BP (!) 125/98  Pulse Rate (!) 123  Resp (!) 22  SpO2 95 %  O2 Device HFNC  O2 Flow Rate (L/min) 11 L/min  Assess: MEWS Score  MEWS Temp 0  MEWS Systolic 0  MEWS Pulse 2  MEWS RR 1  MEWS LOC 0  MEWS Score 3  MEWS Score Color Yellow  Assess: if the MEWS score is Yellow or Red  Were vital signs taken at a resting state? Yes  Focused Assessment No change from prior assessment  Early Detection of Sepsis Score *See Row Information* High  MEWS guidelines implemented *See Row Information* No, previously yellow, continue vital signs every 4 hours

## 2020-02-14 NOTE — Progress Notes (Signed)
PROGRESS NOTE    [name]  [id number]:[id number] DOB: Apr 27, 1960 DOA: 02/02/2020 PCP: Baxter Hire, MD    Brief Narrative:  FernandoGarciais a59 y.o.Hispanic malewith a known history of depression and hypertension, who presented to the emergency room with acute onset of dyspnea and feveras well as dry cough and wheezing since Monday. He admits to fever and chills as well as loss of taste and smell. He has been having fatigue and tiredness. He admitted to diarrhea with bright red bleeding per rectum or melena. No nausea or vomiting.He has been exposed to hisson who wasdiagnosed with COVID-19 a couple days ago. 2 daughters also have similar symptoms. The patient was noted to have hypoxemia with pulseoximetryhas been ranging 88 to 89% on room air. The patient has not been vaccinated for COVID-19. As of 02/11/2020 patient remains in progressive cardiac unit dependent on heated high flow nasal cannula.  He is mentating clearly though he looks fatigued.  Normal work of breathing.  12/2: Patient very anxious and tearful this morning.  Repeatedly request to go home.  Explained to him in Spanish that he was markedly hypoxic and that discharge home at this time would not be safe in any way.  He expressed understanding. 12/3: Oxygen status improved over interval.  Wean from heated high flow nasal cannula to high flow nasal cannula at 15 L.  Still anxious but less.  Remains in rapid atrial fibrillation 12/4: Oxygen rate weaned to 11 L.  Patient seems very depressed and anxious still.  Has been unable to speak to his wife for unclear reasons.  I asked the nurse to put them in touch.   Assessment & Plan:   Active Problems:   Atypical pneumonia  Multifocal pneumonia secondary to COVID-19 Severe acute hypoxic respiratory failure secondary to above Oxygen saturation on admission 86% Oxygen requirement has gone up substantially since then Completed course of remdesivir in house Weaned  off heated high flow nasal cannula Now weaned to 11 L high flow nasal cannula Plan: Continue Solu-Medrol, 60 mg every 12 hours Follow daily inflammatory markers, overall downtrending Continue baricitinib Stress I-S and flutter use Out of bed to chair as tolerated Combivent 4 times daily Wean oxygen as tolerated  Acute anxiety/depression 02/12/20 patient very tearful and anxious Wishes to go home Requests medication for antianxiety Appears mildly improved over interval Plan: Xanax 0.25 mg orally 3 times a day as needed Frequent reassurance  Atrial fibrillation with rapid ventricular response No documented history of arrhythmia Development possibly due to Covid infection and hypoxia Cardiology following Rate remains uncontrolled Plan: Amiodarone 400 mg p.o. twice daily Diltiazem 60 mg every 6 hours Eliquis for anticoagulation Telemetry monitoring Cardiology follow-up Still not recommending cardioversion  Hypokalemia Monitor and replace as needed Maintain K greater than 4, mag greater than 2  Hyponatremia, resolved Monitor  Essential hypertension Not taking any home medications Continue continue diltiazem as above  Depression Continue home Prozac  GERD Pepcid  Hyperglycemia No history of DM Likely due to steroids Continue Accu-Cheks and sliding scale insulin  DVT prophylaxis: Lovenox Code Status: Full Family Communication: Wife [name] [phone] on 02/13/2020 Disposition Plan: Status is: Inpatient  Remains inpatient appropriate because:Inpatient level of care appropriate due to severity of illness   Dispo: The patient is from: Home              Anticipated d/c is to: Home              Anticipated d/c date is: 2 days  Patient currently is not medically stable to d/c.   Patient remains on 11 L high flow nasal cannula.  Improving over interval.  Heart rate remains uncontrolled.  Not medically stable for discharge  Consultants:    Cardiology-[facility]  Procedures:   None  Antimicrobials:   None   Subjective: Patient seen and examined.  Still anxious, improving.  Seems depressed.  Requesting to go home.  Objective: Vitals:   02/14/20 0503 02/14/20 0602 02/14/20 0844 02/14/20 1230  BP: 115/81  (!) 111/91 103/69  Pulse: (!) 118  (!) 110 (!) 121  Resp:   (!) 21 (!) 21  Temp:   98.7 F (37.1 C) 98.6 F (37 C)  TempSrc:   Oral Oral  SpO2:   93% 96%  Weight:  88.3 kg    Height:        Intake/Output Summary (Last 24 hours) at 02/14/2020 1310 Last data filed at 02/14/2020 1231 Gross per 24 hour  Intake 360 ml  Output 800 ml  Net -440 ml   Filed Weights   02/12/20 0602 02/13/20 0522 02/14/20 0602  Weight: 90 kg 88.2 kg 88.3 kg    Examination:  General exam: Depressed Respiratory system: Bibasilar crackles.  Normal work of breathing.  11 L cardiovascular system: Tachycardic, irregular rhythm, no murmurs, no pedal edema  gastrointestinal system: Abdomen is nondistended, soft and nontender. No organomegaly or masses felt. Normal bowel sounds heard. Central nervous system: Alert and oriented. No focal neurological deficits. Extremities: Symmetric 5 x 5 power. Skin: No rashes, lesions or ulcers Psychiatry: Judgement and insight appear impaired. Mood & affect anxious.     Data Reviewed: I have personally reviewed following labs and imaging studies  CBC: Recent Labs  Lab 02/10/20 0400 02/11/20 0426 02/12/20 0733 02/13/20 0428 02/14/20 0438  WBC 17.3* 19.6* 21.7* 18.8* 27.0*  NEUTROABS  --   --  19.2* 16.8* 24.9*  HGB 15.3 15.6 15.3 15.1 16.0  HCT 45.4 48.1 46.5 46.5 49.0  MCV 95.8 97.8 97.1 99.1 97.6  PLT 482* 607* 701* 653* 161*   Basic Metabolic Panel: Recent Labs  Lab 02/08/20 0544 02/08/20 0544 02/09/20 0551 02/09/20 0551 02/10/20 0400 02/11/20 0426 02/12/20 0733 02/13/20 0428 02/14/20 0438  NA 137   < > 140   < > 138 137 137 139 140  K 3.9   < > 4.3   < > 4.4 4.4  4.3 4.4 4.5  CL 100   < > 102   < > 99 98 98 96* 96*  CO2 27   < > 27   < > 27 28 27  32 30  GLUCOSE 210*   < > 215*   < > 203* 265* 211* 159* 176*  BUN 34*   < > 39*   < > 40* 39* 35* 40* 43*  CREATININE 1.14   < > 1.23   < > 1.16 1.30* 1.05 1.18 1.19  CALCIUM 8.6*   < > 8.5*   < > 8.7* 8.6* 8.5* 8.6* 8.5*  MG 3.0*  --  3.1*  --  3.2* 2.7*  --   --   --    < > = values in this interval not displayed.   GFR: Estimated Creatinine Clearance: 72.4 mL/min (by C-G formula based on SCr of 1.19 mg/dL). Liver Function Tests: No results for input(s): AST, ALT, ALKPHOS, BILITOT, PROT, ALBUMIN in the last 168 hours. No results for input(s): LIPASE, AMYLASE in the last 168 hours. No results for input(s):  AMMONIA in the last 168 hours. Coagulation Profile: No results for input(s): INR, PROTIME in the last 168 hours. Cardiac Enzymes: No results for input(s): CKTOTAL, CKMB, CKMBINDEX, TROPONINI in the last 168 hours. BNP (last 3 results) No results for input(s): PROBNP in the last 8760 hours. HbA1C: No results for input(s): HGBA1C in the last 72 hours. CBG: Recent Labs  Lab 02/13/20 1154 02/13/20 1615 02/13/20 2124 02/14/20 0843 02/14/20 1229  GLUCAP 275* 142* 113* 175* 167*   Lipid Profile: No results for input(s): CHOL, HDL, LDLCALC, TRIG, CHOLHDL, LDLDIRECT in the last 72 hours. Thyroid Function Tests: No results for input(s): TSH, T4TOTAL, FREET4, T3FREE, THYROIDAB in the last 72 hours. Anemia Panel: No results for input(s): VITAMINB12, FOLATE, FERRITIN, TIBC, IRON, RETICCTPCT in the last 72 hours. Sepsis Labs: No results for input(s): PROCALCITON, LATICACIDVEN in the last 168 hours.  Recent Results (from the past 240 hour(s))  Resp Panel by RT-PCR (Flu A&B, Covid) Nasopharyngeal Swab     Status: Abnormal   Collection Time: 01/20/2020  8:59 PM   Specimen: Nasopharyngeal Swab; Nasopharyngeal(NP) swabs in vial transport medium  Result Value Ref Range Status   SARS Coronavirus 2 by RT  PCR POSITIVE (A) NEGATIVE Final    Comment: RESULT CALLED TO, READ BACK BY AND VERIFIED WITH: MCKENZIE WALLACE @2333  ON 01/19/2020 SK[facility] (NOTE) SARS-CoV-2 target nucleic acids are DETECTED.  The SARS-CoV-2 RNA is generally detectable in upper respiratory specimens during the acute phase of infection. Positive results are indicative of the presence of the identified virus, but do not rule out bacterial infection or co-infection with other pathogens not detected by the test. Clinical correlation with patient history and other diagnostic information is necessary to determine patient infection status. The expected result is Negative.  Fact Sheet for Patients: EntrepreneurPulse.com.au  Fact Sheet for Healthcare Providers: IncredibleEmployment.be  This test is not yet approved or cleared by the [place] FDA and  has been authorized for detection and/or diagnosis of SARS-CoV-2 by FDA under an Emergency Use Authorization (EUA).  This EUA will remain in effect (meaning this test c an be used) for the duration of  the COVID-19 declaration under Section 564(b)(1) of the Act, 21 U.S.C. section 360bbb-3(b)(1), unless the authorization is terminated or revoked sooner.     Influenza A by PCR NEGATIVE NEGATIVE Final   Influenza B by PCR NEGATIVE NEGATIVE Final    Comment: (NOTE) The Xpert Xpress SARS-CoV-2/FLU/RSV plus assay is intended as an aid in the diagnosis of influenza from Nasopharyngeal swab specimens and should not be used as a sole basis for treatment. Nasal washings and aspirates are unacceptable for Xpert Xpress SARS-CoV-2/FLU/RSV testing.  Fact Sheet for Patients: EntrepreneurPulse.com.au  Fact Sheet for Healthcare Providers: IncredibleEmployment.be  This test is not yet approved or cleared by the [place] FDA and has been authorized for detection and/or diagnosis of SARS-CoV-2 by FDA under an  Emergency Use Authorization (EUA). This EUA will remain in effect (meaning this test can be used) for the duration of the COVID-19 declaration under Section 564(b)(1) of the Act, 21 U.S.C. section 360bbb-3(b)(1), unless the authorization is terminated or revoked.  Performed at [facility], [address] [place], [place] [address]          Radiology Studies: No results found.      Scheduled Meds: . amiodarone  400 mg Oral BID  . apixaban  5 mg Oral BID  . vitamin C  500 mg Oral Daily  . aspirin EC  81  mg Oral Daily  . baricitinib  4 mg Oral Daily  . diltiazem  60 mg Oral Q6H  . famotidine  20 mg Oral BID  . feeding supplement  237 mL Oral BID BM  . FLUoxetine  40 mg Oral QHS  . guaiFENesin  600 mg Oral BID  . insulin aspart  0-15 Units Subcutaneous TID WC  . insulin aspart  0-5 Units Subcutaneous QHS  . insulin aspart  3 Units Subcutaneous TID WC  . insulin glargine  11 Units Subcutaneous Daily  . Ipratropium-Albuterol  1 puff Inhalation QID  . methylPREDNISolone (SOLU-MEDROL) injection  60 mg Intravenous Q12H  . multivitamin with minerals  1 tablet Oral Daily  . polyethylene glycol  34 g Oral BID  . zinc sulfate  220 mg Oral Daily   Continuous Infusions:    LOS: 10 days    Time spent: 25 minutes    Sidney Ace, MD [facility] Pager [phone]  If 7PM-7AM, please contact night-coverage 02/14/2020, 1:10 PM

## 2020-02-14 NOTE — Progress Notes (Signed)
Spoke with dqaughter, Elenor Legato, on telephone about pt's condition and she would like pt to work with PT asap and try to get pt home asasp due to his depressive nature.

## 2020-02-15 ENCOUNTER — Inpatient Hospital Stay: Payer: Medicare Other

## 2020-02-15 DIAGNOSIS — I4891 Unspecified atrial fibrillation: Secondary | ICD-10-CM | POA: Diagnosis not present

## 2020-02-15 DIAGNOSIS — U071 COVID-19: Secondary | ICD-10-CM | POA: Diagnosis not present

## 2020-02-15 DIAGNOSIS — J9601 Acute respiratory failure with hypoxia: Secondary | ICD-10-CM | POA: Diagnosis not present

## 2020-02-15 LAB — BASIC METABOLIC PANEL
Anion gap: 10 (ref 5–15)
BUN: 45 mg/dL — ABNORMAL HIGH (ref 8–23)
CO2: 28 mmol/L (ref 22–32)
Calcium: 8.4 mg/dL — ABNORMAL LOW (ref 8.9–10.3)
Chloride: 97 mmol/L — ABNORMAL LOW (ref 98–111)
Creatinine, Ser: 1.13 mg/dL (ref 0.61–1.24)
GFR, Estimated: >60 mL/min (ref >60)
Glucose, Bld: 135 mg/dL — ABNORMAL HIGH (ref 70–99)
Potassium: 4.7 mmol/L (ref 3.5–5.1)
Sodium: 135 mmol/L (ref 135–145)

## 2020-02-15 LAB — GLUCOSE, CAPILLARY
Glucose-Capillary: 132 mg/dL — ABNORMAL HIGH (ref 70–99)
Glucose-Capillary: 204 mg/dL — ABNORMAL HIGH (ref 70–99)
Glucose-Capillary: 192 mg/dL — ABNORMAL HIGH (ref 70–99)
Glucose-Capillary: 172 mg/dL — ABNORMAL HIGH (ref 70–99)

## 2020-02-15 LAB — C-REACTIVE PROTEIN: CRP: 4.5 mg/dL — ABNORMAL HIGH (ref <1.0)

## 2020-02-15 MED ORDER — METHYLPREDNISOLONE SODIUM SUCC 40 MG IJ SOLR: 40.0000 mg | Freq: Two times a day (BID) | INTRAMUSCULAR | Status: DC

## 2020-02-15 MED ORDER — SENNOSIDES-DOCUSATE SODIUM 8.6-50 MG PO TABS
1.0000 | ORAL_TABLET | Freq: Two times a day (BID) | ORAL | Status: DC
  Administered 2020-02-15 – 2020-02-17 (×4): 1 via ORAL
  Filled 2020-02-15 (×5): qty 1

## 2020-02-15 MED ORDER — METOPROLOL TARTRATE 25 MG PO TABS
25.0000 mg | ORAL_TABLET | Freq: Two times a day (BID) | ORAL | Status: DC
  Administered 2020-02-15 – 2020-02-17 (×4): 25 mg via ORAL
  Filled 2020-02-15 (×4): qty 1

## 2020-02-15 MED ORDER — FUROSEMIDE 10 MG/ML IJ SOLN
40.0000 mg | Freq: Once | INTRAMUSCULAR | Status: AC
  Administered 2020-02-15: 40 mg via INTRAVENOUS
  Filled 2020-02-15: qty 4

## 2020-02-15 MED ORDER — IPRATROPIUM-ALBUTEROL 20-100 MCG/ACT IN AERS
1.0000 | INHALATION_SPRAY | Freq: Four times a day (QID) | RESPIRATORY_TRACT | Status: DC | PRN
  Filled 2020-02-15: qty 4

## 2020-02-15 MED ORDER — DILTIAZEM HCL 30 MG PO TABS
90.0000 mg | ORAL_TABLET | Freq: Four times a day (QID) | ORAL | Status: DC
  Administered 2020-02-15 – 2020-02-17 (×8): 90 mg via ORAL
  Filled 2020-02-15 (×8): qty 3

## 2020-02-15 MED ORDER — BISACODYL 10 MG RE SUPP
10.0000 mg | Freq: Every day | RECTAL | Status: DC | PRN
  Administered 2020-02-15: 10 mg via RECTAL
  Filled 2020-02-15: qty 1

## 2020-02-15 MED ORDER — METOCLOPRAMIDE HCL 5 MG/ML IJ SOLN
10.0000 mg | Freq: Three times a day (TID) | INTRAMUSCULAR | Status: DC
  Administered 2020-02-15 – 2020-02-19 (×12): 10 mg via INTRAVENOUS
  Filled 2020-02-15 (×12): qty 2

## 2020-02-15 MED ORDER — METHYLPREDNISOLONE SODIUM SUCC 125 MG IJ SOLR
60.0000 mg | Freq: Two times a day (BID) | INTRAMUSCULAR | Status: DC
  Administered 2020-02-15 – 2020-02-17 (×4): 60 mg via INTRAVENOUS
  Filled 2020-02-15 (×4): qty 2

## 2020-02-15 MED ORDER — METOPROLOL TARTRATE 5 MG/5ML IV SOLN
2.5000 mg | INTRAVENOUS | Status: DC | PRN
  Administered 2020-02-15 – 2020-02-16 (×5): 2.5 mg via INTRAVENOUS
  Filled 2020-02-15 (×5): qty 5

## 2020-02-15 NOTE — Evaluation (Addendum)
Physical Therapy Evaluation Patient Details Name: Alfred Clements MRN: [id number] DOB: 31-Mar-1960 Today's Date: 02/15/2020   History of Present Illness  Alfred Clements  is a 59 y.o. Hispanic male with a known history of depression and hypertension, who presented to the emergency room with acute onset of dyspnea and fever as well as dry cough and wheezing. COVID positive with spesis related to COVID, COVID-19 pneumonia, generalized weakness and fatigue, rapid atrial fibrilation.   Clinical Impression  Patient is agreeable to PT evaluation. Spanish interpreter used via [place] with ID number [id number]. Patient required supervision for bed mobility. Heart rate intermittently increased to 143bpm with activity but decreased to 115bpm with rest breaks. Sp02 decreased to low 80's with sitting upright and increased to high 80's with 2-3 minute seated rest break and cues for breathing techniques. Min A for steadying with transfers performed from bed to bed side commode and back. Sp02 decreased to 76% on 11 L02 with standing with standing tolerance of less than one minute, increased trunk sway in standing and minimal assistance required for steadying. With seated rest break and cues for breathing techniques, Sp02 increased to 89-90% within 2-3 minutes. Patient fatigued with activity. Encouraged LE exercise in bed for strengthening. Recommend PT to maximize independence and address functional limitations listed below. Recommend HHPT with supervision from family at discharge at this time.     Follow Up Recommendations Home health PT;Supervision/Assistance - 24 hour    Equipment Recommendations  Rolling walker with 5" wheels    Recommendations for Other Services       Precautions / Restrictions Precautions Precautions: Fall Restrictions Weight Bearing Restrictions: No      Mobility  Bed Mobility Overal bed mobility: Needs Assistance Bed Mobility: Supine to Sit;Sit to Supine     Supine to sit:  Supervision Sit to supine: Supervision   General bed mobility comments: verbal cues for task initiation     Transfers Overall transfer level: Needs assistance   Transfers: Sit to/from Stand;Stand Pivot Transfers Sit to Stand: Min assist Stand pivot transfers: Min assist       General transfer comment: Min A for steadying with multiple transfers performed. Verbal cues for safety and technique   Ambulation/Gait             General Gait Details: gait deferred as Sp02 decreased to 76% after standing   Stairs            Wheelchair Mobility    Modified Rankin (Stroke Patients Only)       Balance Overall balance assessment: Needs assistance Sitting-balance support: Feet supported Sitting balance-Leahy Scale: Good     Standing balance support: No upper extremity supported Standing balance-Leahy Scale: Poor Standing balance comment: increased unsteadiness with trunk sway noted with increased standing time. Min A for safety                              Pertinent Vitals/Pain Pain Assessment: No/denies pain    Home Living Family/patient expects to be discharged to:: Private residence Living Arrangements: Spouse/significant other;Children Available Help at Discharge: Family Type of Home: House Home Access: Stairs to enter Entrance Stairs-Rails: None Entrance Stairs-Number of Steps: 3   Home Equipment: None      Prior Function Level of Independence: Independent               Hand Dominance        Extremity/Trunk Assessment   Upper Extremity Assessment Upper  Extremity Assessment: Defer to OT evaluation    Lower Extremity Assessment Lower Extremity Assessment: Generalized weakness       Communication   Communication: Interpreter utilized  Cognition Arousal/Alertness: Awake/alert Behavior During Therapy: Flat affect Overall Cognitive Status: Within Functional Limits for tasks assessed                                         General Comments      Exercises General Exercises - Lower Extremity Ankle Circles/Pumps: AROM;Strengthening;Both;5 reps;Supine Heel Slides: AAROM;Strengthening;Both;5 reps;Supine Other Exercises Other Exercises: verbal and visual cues for technique    Assessment/Plan    PT Assessment Patient needs continued PT services  PT Problem List Decreased strength;Decreased activity tolerance;Decreased balance;Decreased mobility;Decreased safety awareness;Decreased knowledge of precautions;Cardiopulmonary status limiting activity       PT Treatment Interventions DME instruction;Gait training;Stair training;Functional mobility training;Therapeutic activities;Therapeutic exercise;Balance training;Patient/family education    PT Goals (Current goals can be found in the Care Plan section)  Acute Rehab PT Goals Patient Stated Goal: to go home  PT Goal Formulation: With patient Time For Goal Achievement: 02/29/20 Potential to Achieve Goals: Good    Frequency Min 2X/week   Barriers to discharge        Co-evaluation PT/OT/SLP Co-Evaluation/Treatment: Yes Reason for Co-Treatment: Complexity of the patient's impairments (multi-system involvement) PT goals addressed during session: Mobility/safety with mobility         AM-PAC PT "6 Clicks" Mobility  Outcome Measure Help needed turning from your back to your side while in a flat bed without using bedrails?: A Little Help needed moving from lying on your back to sitting on the side of a flat bed without using bedrails?: A Little Help needed moving to and from a bed to a chair (including a wheelchair)?: A Little Help needed standing up from a chair using your arms (e.g., wheelchair or bedside chair)?: A Little Help needed to walk in hospital room?: A Lot Help needed climbing 3-5 steps with a railing? : A Lot 6 Click Score: 16    End of Session Equipment Utilized During Treatment: Oxygen Activity Tolerance: Patient limited by  fatigue Patient left: in bed;with call bell/phone within reach;with bed alarm set Nurse Communication: Mobility status PT Visit Diagnosis: Unsteadiness on feet (R26.81);Muscle weakness (generalized) (M62.81)    Time: 0932-6712 PT Time Calculation (min) (ACUTE ONLY): 43 min   Charges:   PT Evaluation $PT Eval High Complexity: 1 High PT Treatments $Therapeutic Exercise: 8-22 mins $Therapeutic Activity: 8-22 mins        Minna Merritts, PT, MPT   Percell Locus 02/15/2020, 1:11 PM

## 2020-02-15 NOTE — Progress Notes (Signed)
[facility] Cardiology    SUBJECTIVE: Dyspneic short of breath weak fatigue tachycardia denies any pain   Vitals:   02/14/20 2318 02/15/20 0458 02/15/20 0838 02/15/20 1000  BP: (!) 142/96 124/85 (!) 128/95 (!) 125/108  Pulse: (!) 112 (!) 122 89 (!) 25  Resp: 20 18 18 19   Temp: 97.6 F (36.4 C) (!) 96.4 F (35.8 C) 98.4 F (36.9 C)   TempSrc: Axillary Axillary Oral   SpO2: 95% 92% 95% 93%  Weight:  86.2 kg    Height:         Intake/Output Summary (Last 24 hours) at 02/15/2020 1237 Last data filed at 02/15/2020 0915 Gross per 24 hour  Intake 240 ml  Output 1050 ml  Net -810 ml      PHYSICAL EXAM  General: Well developed, well nourished, in no acute distress HEENT:  Normocephalic and atramatic Neck:  No JVD.  Lungs: Clear bilaterally to auscultation and percussion. Heart: Irregular irregular tachycardic. Normal S1 and S2 without gallops or murmurs.  Abdomen: Bowel sounds are positive, abdomen soft and non-tender  Msk:  Back normal, normal gait. Normal strength and tone for age. Extremities: No clubbing, cyanosis or edema.   Neuro: Alert and oriented X 3. Psych:  Good affect, responds appropriately   LABS: Basic Metabolic Panel: Recent Labs    02/14/20 0438 02/15/20 0718  NA 140 135  K 4.5 4.7  CL 96* 97*  CO2 30 28  GLUCOSE 176* 135*  BUN 43* 45*  CREATININE 1.19 1.13  CALCIUM 8.5* 8.4*   Liver Function Tests: No results for input(s): AST, ALT, ALKPHOS, BILITOT, PROT, ALBUMIN in the last 72 hours. No results for input(s): LIPASE, AMYLASE in the last 72 hours. CBC: Recent Labs    02/13/20 0428 02/14/20 0438  WBC 18.8* 27.0*  NEUTROABS 16.8* 24.9*  HGB 15.1 16.0  HCT 46.5 49.0  MCV 99.1 97.6  PLT 653* 646*   Cardiac Enzymes: No results for input(s): CKTOTAL, CKMB, CKMBINDEX, TROPONINI in the last 72 hours. BNP: Invalid input(s): POCBNP D-Dimer: No results for input(s): DDIMER in the last 72 hours. Hemoglobin A1C: No results for input(s): HGBA1C in  the last 72 hours. Fasting Lipid Panel: No results for input(s): CHOL, HDL, LDLCALC, TRIG, CHOLHDL, LDLDIRECT in the last 72 hours. Thyroid Function Tests: No results for input(s): TSH, T4TOTAL, T3FREE, THYROIDAB in the last 72 hours.  Invalid input(s): FREET3 Anemia Panel: No results for input(s): VITAMINB12, FOLATE, FERRITIN, TIBC, IRON, RETICCTPCT in the last 72 hours.  No results found.   Echo as above ventricular function  TELEMETRY: Rapid atrial fibrillation  ASSESSMENT AND PLAN:  Active Problems:   Atypical pneumonia Rapid atrial fibrillation Shortness of breath dyspnea Generalized weakness and fatigue COVID-19 pneumonia Sepsis related to Covid         Yolonda Kida, MD 02/15/2020 12:37 PM

## 2020-02-15 NOTE — Progress Notes (Addendum)
Patient tolerated suppository well -  Had a moderate size BM.

## 2020-02-15 NOTE — Progress Notes (Signed)
PROGRESS NOTE    Alfred Clements  DZH:[id number] DOB: [birth year] DOA: 02/02/2020 PCP: Baxter Hire, MD    Brief Narrative:  FernandoGarciais a59 y.o.Hispanic malewith a known history of depression and hypertension, who presented to the emergency room with acute onset of dyspnea and feveras well as dry cough and wheezing since Monday. He admits to fever and chills as well as loss of taste and smell. He has been having fatigue and tiredness. He admitted to diarrhea with bright red bleeding per rectum or melena. No nausea or vomiting.He has been exposed to hisson who wasdiagnosed with COVID-19 a couple days ago. 2 daughters also have similar symptoms. The patient was noted to have hypoxemia with pulseoximetryhas been ranging 88 to 89% on room air. The patient has not been vaccinated for COVID-19. As of 02/11/2020 patient remains in progressive cardiac unit dependent on heated high flow nasal cannula.  He is mentating clearly though he looks fatigued.  Normal work of breathing.  12/2: Patient very anxious and tearful this morning.  Repeatedly request to go home.  Explained to him in Spanish that he was markedly hypoxic and that discharge home at this time would not be safe in any way.  He expressed understanding. 12/3: Oxygen status improved over interval.  Wean from heated high flow nasal cannula to high flow nasal cannula at 15 L.  Still anxious but less.  Remains in rapid atrial fibrillation 12/4: Oxygen rate weaned to 11 L.  Patient seems very depressed and anxious still.  Has been unable to speak to his wife for unclear reasons.  I asked the nurse to put them in touch. 12/5: Oxygen rate at 10 L.  Continues to be depressed.  Long conversation with the patient's wife via phone today.  Reassured her that she continues to make daily progress as far as respiratory status goes.  Patient remains very depressed.   Assessment & Plan:   Active Problems:   Atypical  pneumonia  Multifocal pneumonia secondary to COVID-19 Severe acute hypoxic respiratory failure secondary to above Oxygen saturation on admission 86% Oxygen requirement has gone up substantially since then Completed course of remdesivir in house Weaned off heated high flow nasal cannula Now weaned to 10 L high flow nasal cannula Plan: Continue Solu-Medrol, dose decreased to 40 mg every 12 hours Follow daily inflammatory markers, overall downtrending Continue baricitinib Stress I-S and flutter use Out of bed to chair as tolerated Combivent 4 times daily Wean oxygen as tolerated  Acute anxiety/depression 02/12/20 patient very tearful and anxious Wishes to go home Requests medication for antianxiety Wife is concerned that medication could be worsening or causing delirium Plan: DC as needed Xanax Frequent reassurance  Atrial fibrillation with rapid ventricular response No documented history of arrhythmia Development possibly due to Covid infection and hypoxia Cardiology following Rate remains uncontrolled Plan: Amiodarone 400 mg p.o. twice daily Increase Cardizem to 90 mg every 6 hours Eliquis for anticoagulation Telemetry monitoring Cardiology follow-up Still not recommending cardioversion  Hypokalemia Monitor and replace as needed Maintain K greater than 4, mag greater than 2  Hyponatremia, resolved Monitor  Essential hypertension Not taking any home medications Continue continue diltiazem as above  Depression Continue home Prozac  GERD Pepcid  Hyperglycemia No history of DM Likely due to steroids Continue Accu-Cheks and sliding scale insulin  DVT prophylaxis: Lovenox Code Status: Full Family Communication: Wife Alfred Clements [phone] on 02/15/2020 Disposition Plan: Status is: Inpatient  Remains inpatient appropriate because:Inpatient level of care appropriate due to severity  of illness   Dispo: The patient is from: Home              Anticipated  d/c is to: Home              Anticipated d/c date is: 3 days              Patient currently is not medically stable to d/c.   Patient remains on 10 L nasal cannula.  Not medically stable for discharge at this time.  Hospital course has been complicated by severe depressive symptoms.  I explained at length to the patient and his wife today why we could not discharge him until he was to reach at most 4 L on the nasal cannula.  I also spoke to the bedside nurse and requested a video conference between the patient and his wife.  Consultants:   Cardiology-[facility]  Procedures:   None  Antimicrobials:   None   Subjective: Patient seen and examined.  Appears depressed.  Oxygen improved.  Now on 10 L high flow  Objective: Vitals:   02/14/20 2318 02/15/20 0458 02/15/20 0838 02/15/20 1000  BP: (!) 142/96 124/85 (!) 128/95 (!) 125/108  Pulse: (!) 112 (!) 122 89 (!) 25  Resp: 20 18 18 19   Temp: 97.6 F (36.4 C) (!) 96.4 F (35.8 C) 98.4 F (36.9 C)   TempSrc: Axillary Axillary Oral   SpO2: 95% 92% 95% 93%  Weight:  86.2 kg    Height:        Intake/Output Summary (Last 24 hours) at 02/15/2020 1201 Last data filed at 02/15/2020 0915 Gross per 24 hour  Intake 240 ml  Output 1250 ml  Net -1010 ml   Filed Weights   02/13/20 0522 02/14/20 0602 02/15/20 0458  Weight: 88.2 kg 88.3 kg 86.2 kg    Examination:  General exam: Appears depressed Respiratory system: Bibasilar crackles.  Normal work of breathing.  10 L cardiovascular system: Tachycardic, irregular rhythm, no murmurs, no pedal edema  gastrointestinal system: Abdomen is nondistended, soft and nontender. No organomegaly or masses felt. Normal bowel sounds heard. Central nervous system: Alert and oriented. No focal neurological deficits. Extremities: Symmetric 5 x 5 power. Skin: No rashes, lesions or ulcers Psychiatry: Judgement and insight appear impaired. Mood & affect anxious.   Data Reviewed: I have personally  reviewed following labs and imaging studies  CBC: Recent Labs  Lab 02/10/20 0400 02/11/20 0426 02/12/20 0733 02/13/20 0428 02/14/20 0438  WBC 17.3* 19.6* 21.7* 18.8* 27.0*  NEUTROABS  --   --  19.2* 16.8* 24.9*  HGB 15.3 15.6 15.3 15.1 16.0  HCT 45.4 48.1 46.5 46.5 49.0  MCV 95.8 97.8 97.1 99.1 97.6  PLT 482* 607* 701* 653* 017*   Basic Metabolic Panel: Recent Labs  Lab 02/09/20 0551 02/09/20 0551 02/10/20 0400 02/10/20 0400 02/11/20 0426 02/12/20 0733 02/13/20 0428 02/14/20 0438 02/15/20 0718  NA 140   < > 138   < > 137 137 139 140 135  K 4.3   < > 4.4   < > 4.4 4.3 4.4 4.5 4.7  CL 102   < > 99   < > 98 98 96* 96* 97*  CO2 27   < > 27   < > 28 27 32 30 28  GLUCOSE 215*   < > 203*   < > 265* 211* 159* 176* 135*  BUN 39*   < > 40*   < > 39* 35* 40* 43* 45*  CREATININE 1.23   < > 1.16   < > 1.30* 1.05 1.18 1.19 1.13  CALCIUM 8.5*   < > 8.7*   < > 8.6* 8.5* 8.6* 8.5* 8.4*  MG 3.1*  --  3.2*  --  2.7*  --   --   --   --    < > = values in this interval not displayed.   GFR: Estimated Creatinine Clearance: 69.8 mL/min (by C-G formula based on SCr of 1.13 mg/dL). Liver Function Tests: No results for input(s): AST, ALT, ALKPHOS, BILITOT, PROT, ALBUMIN in the last 168 hours. No results for input(s): LIPASE, AMYLASE in the last 168 hours. No results for input(s): AMMONIA in the last 168 hours. Coagulation Profile: No results for input(s): INR, PROTIME in the last 168 hours. Cardiac Enzymes: No results for input(s): CKTOTAL, CKMB, CKMBINDEX, TROPONINI in the last 168 hours. BNP (last 3 results) No results for input(s): PROBNP in the last 8760 hours. HbA1C: No results for input(s): HGBA1C in the last 72 hours. CBG: Recent Labs  Lab 02/14/20 0843 02/14/20 1229 02/14/20 1728 02/14/20 2132 02/15/20 0837  GLUCAP 175* 167* 157* 103* 132*   Lipid Profile: No results for input(s): CHOL, HDL, LDLCALC, TRIG, CHOLHDL, LDLDIRECT in the last 72 hours. Thyroid Function  Tests: No results for input(s): TSH, T4TOTAL, FREET4, T3FREE, THYROIDAB in the last 72 hours. Anemia Panel: No results for input(s): VITAMINB12, FOLATE, FERRITIN, TIBC, IRON, RETICCTPCT in the last 72 hours. Sepsis Labs: No results for input(s): PROCALCITON, LATICACIDVEN in the last 168 hours.  No results found for this or any previous visit (from the past 240 hour(s)).       Radiology Studies: No results found.      Scheduled Meds: . amiodarone  400 mg Oral BID  . apixaban  5 mg Oral BID  . vitamin C  500 mg Oral Daily  . aspirin EC  81 mg Oral Daily  . baricitinib  4 mg Oral Daily  . diltiazem  90 mg Oral Q6H  . famotidine  20 mg Oral BID  . feeding supplement  237 mL Oral BID BM  . FLUoxetine  40 mg Oral QHS  . guaiFENesin  600 mg Oral BID  . insulin aspart  0-15 Units Subcutaneous TID WC  . insulin aspart  0-5 Units Subcutaneous QHS  . insulin aspart  3 Units Subcutaneous TID WC  . insulin glargine  11 Units Subcutaneous Daily  . Ipratropium-Albuterol  1 puff Inhalation QID  . methylPREDNISolone (SOLU-MEDROL) injection  40 mg Intravenous Q12H  . multivitamin with minerals  1 tablet Oral Daily  . polyethylene glycol  34 g Oral BID  . zinc sulfate  220 mg Oral Daily   Continuous Infusions:    LOS: 11 days    Time spent: 25 minutes    Sidney Ace, MD [facility] Pager [phone]  If 7PM-7AM, please contact night-coverage 02/15/2020, 12:01 PM

## 2020-02-15 NOTE — Progress Notes (Signed)
Patient was able to face time with family this evening

## 2020-02-15 NOTE — Evaluation (Signed)
Occupational Therapy Evaluation Patient Details Name: Alfred Clements MRN: [id number] DOB: 29-Jul-1960 Today's Date: 02/15/2020    History of Present Illness Alfred Clements  is a 59 y.o. Hispanic male with a known history of depression and hypertension, who presented to the emergency room with acute onset of dyspnea and fever as well as dry cough and wheezing. COVID positive with spesis related to COVID, COVID-19 pneumonia, generalized weakness and fatigue, rapid atrial fibrilation.    Clinical Impression   Alfred Clements was seen for OT/PT co-evaluation this date. Prior to hospital admission, pt was Independent in mobility and ADLs. Pt lives c family in home c 3 STE. Pt presents to acute OT demonstrating impaired ADL performance and functional mobility 2/2 decreased initiation, functional strength/balance/endurance deficits, and poor insight into deficits. Stratus Interpreter utilized t/o session (Interpreter ID # [id number].)  Pt currently requires SUPERVISION don B socks at bed level - increased time and VCs for rest preaks required. MIN A for BSC t/f - desat with min exertion. Tolerates <26min standing prior to desat - unable to trial standing grooming tasks. Pt would benefit from skilled OT to address noted impairments and functional limitations (see below for any additional details) in order to maximize safety and independence while minimizing falls risk and caregiver burden. Upon hospital discharge, recommend HHOT to maximize pt safety and return to functional independence during meaningful occupations of daily life.  Highest HR seen during mobility 143 - RN notified. Supine: SpO2 low 90s Sitting: desat 84-86%, resolved to 90 c >2 mins and PLB Standing: desat 78%, resolved to high 80s c seated rest.      Follow Up Recommendations  Home health OT;Supervision/Assistance - 24 hour    Equipment Recommendations  3 in 1 bedside commode    Recommendations for Other Services       Precautions /  Restrictions Precautions Precautions: Fall Restrictions Weight Bearing Restrictions: No      Mobility Bed Mobility Overal bed mobility: Needs Assistance Bed Mobility: Supine to Sit;Sit to Supine     Supine to sit: Supervision Sit to supine: Supervision   General bed mobility comments: verbal cues for task initiation     Transfers Overall transfer level: Needs assistance Equipment used: 1 person hand held assist Transfers: Sit to/from Stand;Stand Pivot Transfers Sit to Stand: Min assist Stand pivot transfers: Min assist       General transfer comment: Min A for steadying with multiple transfers performed. Verbal cues for safety and technique     Balance Overall balance assessment: Needs assistance Sitting-balance support: Feet supported Sitting balance-Leahy Scale: Good     Standing balance support: No upper extremity supported Standing balance-Leahy Scale: Poor Standing balance comment: increased unsteadiness with trunk sway noted with increased standing time. Min A for safety         ADL either performed or assessed with clinical judgement   ADL Overall ADL's : Needs assistance/impaired      General ADL Comments: SUPERVISION don B socks at bed level - increased time and VCs for rest preaks required. MIN A for BSC t/f - desat with min exertion. Tolerates <69min standing prior to desat - unable to trial standing grooming tasks.                   Pertinent Vitals/Pain Pain Assessment: No/denies pain     Hand Dominance     Extremity/Trunk Assessment Upper Extremity Assessment Upper Extremity Assessment: Generalized weakness   Lower Extremity Assessment Lower Extremity Assessment: Generalized weakness  Communication Communication Communication: Interpreter utilized [phone])   Cognition Arousal/Alertness: Awake/alert Behavior During Therapy: Flat affect Overall Cognitive Status: Within Functional Limits for tasks assessed          General  Comments  Highest HR seen during mobility 143. Supine: SpO2 low 90s. Sitting: desat 84-86%, resolved to 90 c >2 mins and PLB. Standing: desat 78%, resolved to high 80s c seated rest.     Exercises Exercises: Other exercises General Exercises - Lower Extremity Ankle Circles/Pumps: AROM;Strengthening;Both;5 reps;Supine Heel Slides: AAROM;Strengthening;Both;5 reps;Supine Other Exercises Other Exercises: Pt educated re: OT role, DME recs, d/c recs, falls prevention, ECS, HEP (IS frequency/use) Other Exercises: IS, toilet t/f, sup<>sit, sit<>stand, SPT, sitting/standing balance/toelrance   Shoulder Instructions      Home Living Family/patient expects to be discharged to:: Private residence Living Arrangements: Spouse/significant other;Children Available Help at Discharge: Family Type of Home: House Home Access: Stairs to enter Technical brewer of Steps: 3 Entrance Stairs-Rails: None                 Home Equipment: None          Prior Functioning/Environment Level of Independence: Independent                 OT Problem List: Decreased strength;Decreased activity tolerance;Impaired balance (sitting and/or standing);Decreased safety awareness;Cardiopulmonary status limiting activity      OT Treatment/Interventions: Self-care/ADL training;Therapeutic exercise;Energy conservation;DME and/or AE instruction;Therapeutic activities;Balance training;Patient/family education    OT Goals(Current goals can be found in the care plan section) Acute Rehab OT Goals Patient Stated Goal: to go home  OT Goal Formulation: With patient Time For Goal Achievement: 02/29/20 Potential to Achieve Goals: Good ADL Goals Pt Will Perform Grooming: standing;with modified independence (c LRAD PRN) Pt Will Perform Lower Body Dressing: Independently;sit to/from stand Pt Will Transfer to Toilet: Independently;stand pivot transfer;bedside commode  OT Frequency: Min 2X/week            Co-evaluation PT/OT/SLP Co-Evaluation/Treatment: Yes Reason for Co-Treatment: Complexity of the patient's impairments (multi-system involvement) PT goals addressed during session: Mobility/safety with mobility OT goals addressed during session: ADL's and self-care      AM-PAC OT "6 Clicks" Daily Activity     Outcome Measure Help from another person eating meals?: None Help from another person taking care of personal grooming?: A Little Help from another person toileting, which includes using toliet, bedpan, or urinal?: A Little Help from another person bathing (including washing, rinsing, drying)?: A Little Help from another person to put on and taking off regular upper body clothing?: A Little Help from another person to put on and taking off regular lower body clothing?: A Little 6 Click Score: 19   End of Session Equipment Utilized During Treatment: Oxygen (11L HFNC) Nurse Communication: Mobility status  Activity Tolerance: Patient tolerated treatment well;Patient limited by fatigue Patient left: in bed;with call bell/phone within reach;with bed alarm set  OT Visit Diagnosis: Other abnormalities of gait and mobility (R26.89)                Time: 1660-6301 OT Time Calculation (min): 28 min Charges:  OT General Charges $OT Visit: 1 Visit OT Evaluation $OT Eval Moderate Complexity: 1 Mod OT Treatments $Self Care/Home Management : 8-22 mins  Dessie Coma, M.S. OTR/L  02/15/20, 2:02 PM  ascom [phone]

## 2020-02-15 NOTE — TOC Progression Note (Signed)
Transition of Care [facility]) - Progression Note    Patient Details  Name: Alfred Clements MRN: [id number] Date of Birth: Nov 07, 1960  [facility] CM/SW Contact  Izola Price, RN Phone Number: 02/15/2020, 8:23 AM  Clinical Narrative:   12/5 On 12/4 1615 Per provider notes: Anticipated d/c date is: 3 days (12/7) Patient currently is not medically stable to d/c. Will continue to monitor. Simmie Davies RN CM          Expected Discharge Plan and Services                                                 Social Determinants of Health (SDOH) Interventions    Readmission Risk Interventions No flowsheet data found.

## 2020-02-15 NOTE — Progress Notes (Signed)
[facility] Cardiology    SUBJECTIVE: Patient has significant Covid pneumonia with hypoxemia shortness of breath tachycardia tachypnea weakness fatigue.  Heart rate varies from about 110-130   Vitals:   02/14/20 2318 02/15/20 0458 02/15/20 0838 02/15/20 1000  BP: (!) 142/96 124/85 (!) 128/95 (!) 125/108  Pulse: (!) 112 (!) 122 89 (!) 25  Resp: 20 18 18 19   Temp: 97.6 F (36.4 C) (!) 96.4 F (35.8 C) 98.4 F (36.9 C)   TempSrc: Axillary Axillary Oral   SpO2: 95% 92% 95% 93%  Weight:  86.2 kg    Height:         Intake/Output Summary (Last 24 hours) at 02/15/2020 1233 Last data filed at 02/15/2020 0915 Gross per 24 hour  Intake 240 ml  Output 1050 ml  Net -810 ml      PHYSICAL EXAM  General: Well developed, well nourished, in no acute distress HEENT:  Normocephalic and atramatic Neck:  No JVD.  Lungs: Clear bilaterally to auscultation and percussion. Heart: HRRR . Normal S1 and S2 without gallops or murmurs.  Abdomen: Bowel sounds are positive, abdomen soft and non-tender  Msk:  Back normal, normal gait. Normal strength and tone for age. Extremities: No clubbing, cyanosis or edema.   Neuro: Alert and oriented X 3. Psych:  Good affect, responds appropriately   LABS: Basic Metabolic Panel: Recent Labs    02/14/20 0438 02/15/20 0718  NA 140 135  K 4.5 4.7  CL 96* 97*  CO2 30 28  GLUCOSE 176* 135*  BUN 43* 45*  CREATININE 1.19 1.13  CALCIUM 8.5* 8.4*   Liver Function Tests: No results for input(s): AST, ALT, ALKPHOS, BILITOT, PROT, ALBUMIN in the last 72 hours. No results for input(s): LIPASE, AMYLASE in the last 72 hours. CBC: Recent Labs    02/13/20 0428 02/14/20 0438  WBC 18.8* 27.0*  NEUTROABS 16.8* 24.9*  HGB 15.1 16.0  HCT 46.5 49.0  MCV 99.1 97.6  PLT 653* 646*   Cardiac Enzymes: No results for input(s): CKTOTAL, CKMB, CKMBINDEX, TROPONINI in the last 72 hours. BNP: Invalid input(s): POCBNP D-Dimer: No results for input(s): DDIMER in the last 72  hours. Hemoglobin A1C: No results for input(s): HGBA1C in the last 72 hours. Fasting Lipid Panel: No results for input(s): CHOL, HDL, LDLCALC, TRIG, CHOLHDL, LDLDIRECT in the last 72 hours. Thyroid Function Tests: No results for input(s): TSH, T4TOTAL, T3FREE, THYROIDAB in the last 72 hours.  Invalid input(s): FREET3 Anemia Panel: No results for input(s): VITAMINB12, FOLATE, FERRITIN, TIBC, IRON, RETICCTPCT in the last 72 hours.  No results found.   Echo preserved left ventricular function  TELEMETRY: Atrial fibrillation rapid ventricular response rate of 130  ASSESSMENT AND PLAN:  Active Problems:   Atypical pneumonia Atrial fibrillation rapid ventricular response Elevated white count Shortness of breath dyspnea Mild prerenal insufficiency Tachycardia  Plan Continue telemetry supplemental oxygen Continue respiratory support for pneumonia Covid Will consider metoprolol to help with rate control Continue amiodarone therapy for atrial fibrillation Continue diltiazem for rate control Tachycardia and A. fib probably related to Covid Maintain adequate hydration Continue supportive care   Yolonda Kida, MD 02/15/2020 12:33 PM

## 2020-02-15 NOTE — Progress Notes (Signed)
Patient O2 sat dropped to 78% at 8L.   Complaints of not being able to breath.   Increased back up to 10 L.    Patient also complains of abdominal discomfort and not being about to have a bowel movement.  Paged MD-  Order suppository and senokot.

## 2020-02-16 DIAGNOSIS — J189 Pneumonia, unspecified organism: Secondary | ICD-10-CM

## 2020-02-16 LAB — GLUCOSE, CAPILLARY
Glucose-Capillary: 188 mg/dL — ABNORMAL HIGH (ref 70–99)
Glucose-Capillary: 180 mg/dL — ABNORMAL HIGH (ref 70–99)
Glucose-Capillary: 113 mg/dL — ABNORMAL HIGH (ref 70–99)
Glucose-Capillary: 80 mg/dL (ref 70–99)

## 2020-02-16 MED ORDER — ENSURE ENLIVE PO LIQD
237.0000 mL | Freq: Three times a day (TID) | ORAL | Status: DC
  Administered 2020-02-16 – 2020-02-21 (×11): 237 mL via ORAL

## 2020-02-16 MED ORDER — ALPRAZOLAM 0.25 MG PO TABS
0.2500 mg | ORAL_TABLET | Freq: Every evening | ORAL | Status: DC | PRN
  Administered 2020-02-16: 0.25 mg via ORAL
  Filled 2020-02-16: qty 1

## 2020-02-16 MED ORDER — HYDROXYZINE HCL 25 MG PO TABS
25.0000 mg | ORAL_TABLET | Freq: Three times a day (TID) | ORAL | Status: DC | PRN
  Administered 2020-02-18 – 2020-02-19 (×2): 25 mg via ORAL
  Filled 2020-02-16 (×3): qty 1

## 2020-02-16 MED ORDER — MIRTAZAPINE 15 MG PO TABS
15.0000 mg | ORAL_TABLET | Freq: Every day | ORAL | Status: DC
  Administered 2020-02-16 – 2020-02-20 (×5): 15 mg via ORAL
  Filled 2020-02-16 (×5): qty 1

## 2020-02-16 NOTE — Plan of Care (Signed)
  Problem: Clinical Measurements: Goal: Respiratory complications will improve Outcome: Progressing Goal: Cardiovascular complication will be avoided Outcome: Progressing   Problem: Elimination: Goal: Will not experience complications related to bowel motility Outcome: Progressing Goal: Will not experience complications related to urinary retention Outcome: Progressing   Problem: Safety: Goal: Ability to remain free from injury will improve Outcome: Progressing   Problem: Education: Goal: Knowledge of risk factors and measures for prevention of condition will improve Outcome: Progressing

## 2020-02-16 NOTE — Progress Notes (Signed)
PROGRESS NOTE    Alfred Clements  SNK:[id number] DOB: March 12, 1961 DOA: 02/10/2020 PCP: Baxter Hire, MD    Brief Narrative:  FernandoGarciais a59 y.o.Hispanic malewith a known history of depression and hypertension, who presented to the emergency room with acute onset of dyspnea and feveras well as dry cough and wheezing since Monday. He admits to fever and chills as well as loss of taste and smell. He has been having fatigue and tiredness. He admitted to diarrhea with bright red bleeding per rectum or melena. No nausea or vomiting.He has been exposed to hisson who wasdiagnosed with COVID-19 a couple days ago. 2 daughters also have similar symptoms. The patient was noted to have hypoxemia with pulseoximetryhas been ranging 88 to 89% on room air. The patient has not been vaccinated for COVID-19. As of 02/11/2020 patient remains in progressive cardiac unit dependent on heated high flow nasal cannula.  He is mentating clearly though he looks fatigued.  Normal work of breathing.  12/2: Patient very anxious and tearful this morning.  Repeatedly request to go home.  Explained to him in Spanish that he was markedly hypoxic and that discharge home at this time would not be safe in any way.  He expressed understanding. 12/3: Oxygen status improved over interval.  Wean from heated high flow nasal cannula to high flow nasal cannula at 15 L.  Still anxious but less.  Remains in rapid atrial fibrillation 12/4: Oxygen rate weaned to 11 L.  Patient seems very depressed and anxious still.  Has been unable to speak to his wife for unclear reasons.  I asked the nurse to put them in touch. 12/5: Oxygen rate at 10 L.  Continues to be depressed.  Long conversation with the patient's wife via phone today.  Reassured her that she continues to make daily progress as far as respiratory status goes.  Patient remains very depressed. 12/6: Oxygen remained at 10 to 11 L. Continues to express depression and  anxiety. Remains in rapid atrial fibrillation.   Assessment & Plan:   Active Problems:   Atypical pneumonia  Multifocal pneumonia secondary to COVID-19 Severe acute hypoxic respiratory failure secondary to above Oxygen saturation on admission 86% Oxygen requirement has gone up substantially since then Completed course of remdesivir in house Weaned off heated high flow nasal cannula Now weaned to 10 L high flow nasal cannula Plan: Continue Solu-Medrol, dose decreased to 40 mg every 12 hours Follow daily inflammatory markers, overall downtrending Continue baricitinib Stress I-S and flutter use Out of bed to chair as tolerated Combivent 4 times daily Wean oxygen as tolerated  Acute anxiety/depression 02/12/20 patient very tearful and anxious Wishes to go home Requests medication for antianxiety Wife is concerned that medication could be worsening or causing delirium Plan: DC as needed Xanax Add Atarax 25 mg p.o. every 6 hours as needed Frequent reassurance  Atrial fibrillation with rapid ventricular response No documented history of arrhythmia Development possibly due to Covid infection and hypoxia Cardiology following Rate remains uncontrolled Plan: Amiodarone 400 mg p.o. twice daily Continue Cardizem 90 mg every 6 hours Cardiology considering metoprolol. Use with caution considering acute respiratory illness Eliquis for anticoagulation Telemetry monitoring Cardiology follow-up Still not recommending cardioversion  Hypokalemia Monitor and replace as needed Maintain K greater than 4, mag greater than 2  Hyponatremia, resolved Monitor  Essential hypertension Not taking any home medications Continue continue diltiazem as above  Depression Continue home Prozac  GERD Pepcid  Hyperglycemia No history of DM Likely due to steroids  Continue Accu-Cheks and sliding scale insulin  DVT prophylaxis: Lovenox Code Status: Full Family Communication: Wife [name] [phone] on 02/16/2020 Disposition Plan: Status is: Inpatient  Remains inpatient appropriate because:Inpatient level of care appropriate due to severity of illness   Dispo: The patient is from: Home              Anticipated d/c is to: Home              Anticipated d/c date is: 2 days              Patient currently is not medically stable to d/c.   Improving overall but remains hypoxic requiring 10 L high flow nasal cannula. Anticipate 48 additional hours. Also has uncontrolled atrial fibrillation. Cardiology following.  Consultants:   Cardiology-[facility]  Procedures:   None  Antimicrobials:   None   Subjective: Patient seen and examined. Continues to appear depressed. Oxygen level stable from prior. On 10 L high flow nasal cannula.  Objective: Vitals:   02/16/20 0446 02/16/20 0519 02/16/20 0600 02/16/20 0757  BP: 112/79 118/81 113/79 114/80  Pulse: 86   (!) 104  Resp: 20   (!) 21  Temp: (!) 97.5 F (36.4 C)   98 F (36.7 C)  TempSrc: Oral     SpO2: 95%   91%  Weight:      Height:        Intake/Output Summary (Last 24 hours) at 02/16/2020 1125 Last data filed at 02/16/2020 0300 Gross per 24 hour  Intake 240 ml  Output 1620 ml  Net -1380 ml   Filed Weights   02/14/20 0602 02/15/20 0458 02/16/20 0414  Weight: 88.3 kg 86.2 kg 85.6 kg    Examination:  General exam: Appears depressed Respiratory system: Bibasilar crackles.  Normal work of breathing.  10 L cardiovascular system: Tachycardic, irregular rhythm, no murmurs, no pedal edema  gastrointestinal system: Abdomen is nondistended, soft and nontender. No organomegaly or masses felt. Normal bowel sounds heard. Central nervous system: Alert and oriented. No focal neurological deficits. Extremities: Symmetric 5 x 5 power. Skin: No rashes, lesions or ulcers Psychiatry: Judgement and insight appear impaired. Mood & affect anxious.   Data Reviewed: I have personally reviewed following labs and  imaging studies  CBC: Recent Labs  Lab 02/10/20 0400 02/11/20 0426 02/12/20 0733 02/13/20 0428 02/14/20 0438  WBC 17.3* 19.6* 21.7* 18.8* 27.0*  NEUTROABS  --   --  19.2* 16.8* 24.9*  HGB 15.3 15.6 15.3 15.1 16.0  HCT 45.4 48.1 46.5 46.5 49.0  MCV 95.8 97.8 97.1 99.1 97.6  PLT 482* 607* 701* 653* 001*   Basic Metabolic Panel: Recent Labs  Lab 02/10/20 0400 02/10/20 0400 02/11/20 0426 02/12/20 0733 02/13/20 0428 02/14/20 0438 02/15/20 0718  NA 138   < > 137 137 139 140 135  K 4.4   < > 4.4 4.3 4.4 4.5 4.7  CL 99   < > 98 98 96* 96* 97*  CO2 27   < > 28 27 32 30 28  GLUCOSE 203*   < > 265* 211* 159* 176* 135*  BUN 40*   < > 39* 35* 40* 43* 45*  CREATININE 1.16   < > 1.30* 1.05 1.18 1.19 1.13  CALCIUM 8.7*   < > 8.6* 8.5* 8.6* 8.5* 8.4*  MG 3.2*  --  2.7*  --   --   --   --    < > = values in this interval not displayed.  GFR: Estimated Creatinine Clearance: 69.8 mL/min (by C-G formula based on SCr of 1.13 mg/dL). Liver Function Tests: No results for input(s): AST, ALT, ALKPHOS, BILITOT, PROT, ALBUMIN in the last 168 hours. No results for input(s): LIPASE, AMYLASE in the last 168 hours. No results for input(s): AMMONIA in the last 168 hours. Coagulation Profile: No results for input(s): INR, PROTIME in the last 168 hours. Cardiac Enzymes: No results for input(s): CKTOTAL, CKMB, CKMBINDEX, TROPONINI in the last 168 hours. BNP (last 3 results) No results for input(s): PROBNP in the last 8760 hours. HbA1C: No results for input(s): HGBA1C in the last 72 hours. CBG: Recent Labs  Lab 02/15/20 0837 02/15/20 1358 02/15/20 1701 02/15/20 2121 02/16/20 0755  GLUCAP 132* 204* 192* 172* 188*   Lipid Profile: No results for input(s): CHOL, HDL, LDLCALC, TRIG, CHOLHDL, LDLDIRECT in the last 72 hours. Thyroid Function Tests: No results for input(s): TSH, T4TOTAL, FREET4, T3FREE, THYROIDAB in the last 72 hours. Anemia Panel: No results for input(s): VITAMINB12, FOLATE,  FERRITIN, TIBC, IRON, RETICCTPCT in the last 72 hours. Sepsis Labs: No results for input(s): PROCALCITON, LATICACIDVEN in the last 168 hours.  No results found for this or any previous visit (from the past 240 hour(s)).       Radiology Studies: DG Chest Port 1 View  Result Date: 02/15/2020 CLINICAL DATA:  Hypoxia.  History of hypertension. EXAM: PORTABLE CHEST 1 VIEW COMPARISON:  01/27/2020 FINDINGS: Patchy areas of airspace opacity are noted, similar on the left, more conspicuous on the right, than the previous exam. These are mostly in the mid and lower lungs. Findings are accentuated by low lung volumes. No convincing pleural effusion and no pneumothorax. Cardiac silhouette is normal in size. No mediastinal or hilar masses. Skeletal structures are grossly intact. IMPRESSION: 1. Patchy bilateral airspace lung opacities suspicious for multifocal pneumonia. Electronically Signed   By: Lajean Manes M.D.   On: 02/15/2020 17:41        Scheduled Meds: . amiodarone  400 mg Oral BID  . apixaban  5 mg Oral BID  . vitamin C  500 mg Oral Daily  . aspirin EC  81 mg Oral Daily  . baricitinib  4 mg Oral Daily  . diltiazem  90 mg Oral Q6H  . famotidine  20 mg Oral BID  . feeding supplement  237 mL Oral BID BM  . FLUoxetine  40 mg Oral QHS  . guaiFENesin  600 mg Oral BID  . insulin aspart  0-15 Units Subcutaneous TID WC  . insulin aspart  0-5 Units Subcutaneous QHS  . insulin aspart  3 Units Subcutaneous TID WC  . insulin glargine  11 Units Subcutaneous Daily  . Ipratropium-Albuterol  1 puff Inhalation QID  . methylPREDNISolone (SOLU-MEDROL) injection  60 mg Intravenous Q12H  . metoCLOPramide (REGLAN) injection  10 mg Intravenous Q8H  . metoprolol tartrate  25 mg Oral BID  . multivitamin with minerals  1 tablet Oral Daily  . senna-docusate  1 tablet Oral BID  . zinc sulfate  220 mg Oral Daily   Continuous Infusions:    LOS: 12 days    Time spent: 15 minutes    Sidney Ace, MD [facility] Pager [phone]  If 7PM-7AM, please contact night-coverage 02/16/2020, 11:25 AM

## 2020-02-16 NOTE — Progress Notes (Signed)
PROGRESS NOTE    Alfred Clements  XBJ:[id number] DOB: 08/04/1960 DOA: 02/06/2020 PCP: Baxter Hire, MD    Brief Narrative:  FernandoGarciais a59 y.o.Hispanic malewith a known history of depression and hypertension, who presented to the emergency room with acute onset of dyspnea and feveras well as dry cough and wheezing since Monday. He admits to fever and chills as well as loss of taste and smell. He has been having fatigue and tiredness. He admitted to diarrhea with bright red bleeding per rectum or melena. No nausea or vomiting.He has been exposed to hisson who wasdiagnosed with COVID-19 a couple days ago. 2 daughters also have similar symptoms. The patient was noted to have hypoxemia with pulseoximetryhas been ranging 88 to 89% on room air. The patient has not been vaccinated for COVID-19. As of 02/11/2020 patient remains in progressive cardiac unit dependent on heated high flow nasal cannula.  He is mentating clearly though he looks fatigued.  Normal work of breathing.  12/2: Patient very anxious and tearful this morning.  Repeatedly request to go home.  Explained to him in Spanish that he was markedly hypoxic and that discharge home at this time would not be safe in any way.  He expressed understanding. 12/3: Oxygen status improved over interval.  Wean from heated high flow nasal cannula to high flow nasal cannula at 15 L.  Still anxious but less.  Remains in rapid atrial fibrillation 12/4: Oxygen rate weaned to 11 L.  Patient seems very depressed and anxious still.  Has been unable to speak to his wife for unclear reasons.  I asked the nurse to put them in touch. 12/5: Oxygen rate at 10 L.  Continues to be depressed.  Long conversation with the patient's wife via phone today.  Reassured her that she continues to make daily progress as far as respiratory status goes.  Patient remains very depressed. 12/6: Oxygen remained at 10 to 11 L. Continues to express depression and  anxiety. Remains in rapid atrial fibrillation.   Assessment & Plan:   Active Problems:   Atypical pneumonia  Multifocal pneumonia secondary to COVID-19 Severe acute hypoxic respiratory failure secondary to above Oxygen saturation on admission 86% Oxygen requirement has gone up substantially since then Completed course of remdesivir in house Weaned off heated high flow nasal cannula Now weaned to 10 L high flow nasal cannula Plan: Continue Solu-Medrol, dose decreased to 40 mg every 12 hours Follow daily inflammatory markers, overall downtrending Continue baricitinib Stress I-S and flutter use Out of bed to chair as tolerated Combivent 4 times daily Wean oxygen as tolerated  Acute anxiety/depression 02/12/20 patient very tearful and anxious Wishes to go home Requests medication for antianxiety Wife is concerned that medication could be worsening or causing delirium Plan: DC as needed Xanax Add Atarax 25 mg p.o. every 6 hours as needed Frequent reassurance  Atrial fibrillation with rapid ventricular response No documented history of arrhythmia Development possibly due to Covid infection and hypoxia Cardiology following Rate remains uncontrolled Plan: Amiodarone 400 mg p.o. twice daily Continue Cardizem 90 mg every 6 hours Cardiology considering metoprolol. Use with caution considering acute respiratory illness Eliquis for anticoagulation Telemetry monitoring Cardiology follow-up Still not recommending cardioversion  Hypokalemia Monitor and replace as needed Maintain K greater than 4, mag greater than 2  Hyponatremia, resolved Monitor  Essential hypertension Not taking any home medications Continue continue diltiazem as above  Depression Continue home Prozac  GERD Pepcid  Hyperglycemia No history of DM Likely due to steroids  Continue Accu-Cheks and sliding scale insulin  DVT prophylaxis: Lovenox Code Status: Full Family Communication: Wife Alfred Clements on 02/16/2020 Disposition Plan: Status is: Inpatient  Remains inpatient appropriate because:Inpatient level of care appropriate due to severity of illness   Dispo: The patient is from: Home              Anticipated d/c is to: Home              Anticipated d/c date is: 2 days              Patient currently is not medically stable to d/c.   Improving overall but remains hypoxic requiring 10 L high flow nasal cannula. Anticipate 48 additional hours. Also has uncontrolled atrial fibrillation. Cardiology following.  Consultants:   Cardiology-[facility]  Procedures:   None  Antimicrobials:   None   Subjective: Patient seen and examined. Continues to appear depressed. Oxygen level stable from prior. On 10 L high flow nasal cannula.  Objective: Vitals:   02/16/20 0446 02/16/20 0519 02/16/20 0600 02/16/20 0757  BP: 112/79 118/81 113/79 114/80  Pulse: 86   (!) 104  Resp: 20   (!) 21  Temp: (!) 97.5 F (36.4 C)   98 F (36.7 C)  TempSrc: Oral     SpO2: 95%   91%  Weight:      Height:        Intake/Output Summary (Last 24 hours) at 02/16/2020 1116 Last data filed at 02/16/2020 0300 Gross per 24 hour  Intake 240 ml  Output 1620 ml  Net -1380 ml   Filed Weights   02/14/20 0602 02/15/20 0458 02/16/20 0414  Weight: 88.3 kg 86.2 kg 85.6 kg    Examination:  General exam: Appears depressed Respiratory system: Bibasilar crackles.  Normal work of breathing.  10 L cardiovascular system: Tachycardic, irregular rhythm, no murmurs, no pedal edema  gastrointestinal system: Abdomen is nondistended, soft and nontender. No organomegaly or masses felt. Normal bowel sounds heard. Central nervous system: Alert and oriented. No focal neurological deficits. Extremities: Symmetric 5 x 5 power. Skin: No rashes, lesions or ulcers Psychiatry: Judgement and insight appear impaired. Mood & affect anxious.   Data Reviewed: I have personally reviewed following labs and  imaging studies  CBC: Recent Labs  Lab 02/10/20 0400 02/11/20 0426 02/12/20 0733 02/13/20 0428 02/14/20 0438  WBC 17.3* 19.6* 21.7* 18.8* 27.0*  NEUTROABS  --   --  19.2* 16.8* 24.9*  HGB 15.3 15.6 15.3 15.1 16.0  HCT 45.4 48.1 46.5 46.5 49.0  MCV 95.8 97.8 97.1 99.1 97.6  PLT 482* 607* 701* 653* 681*   Basic Metabolic Panel: Recent Labs  Lab 02/10/20 0400 02/10/20 0400 02/11/20 0426 02/12/20 0733 02/13/20 0428 02/14/20 0438 02/15/20 0718  NA 138   < > 137 137 139 140 135  K 4.4   < > 4.4 4.3 4.4 4.5 4.7  CL 99   < > 98 98 96* 96* 97*  CO2 27   < > 28 27 32 30 28  GLUCOSE 203*   < > 265* 211* 159* 176* 135*  BUN 40*   < > 39* 35* 40* 43* 45*  CREATININE 1.16   < > 1.30* 1.05 1.18 1.19 1.13  CALCIUM 8.7*   < > 8.6* 8.5* 8.6* 8.5* 8.4*  MG 3.2*  --  2.7*  --   --   --   --    < > = values in this interval not displayed.  GFR: Estimated Creatinine Clearance: 69.8 mL/min (by C-G formula based on SCr of 1.13 mg/dL). Liver Function Tests: No results for input(s): AST, ALT, ALKPHOS, BILITOT, PROT, ALBUMIN in the last 168 hours. No results for input(s): LIPASE, AMYLASE in the last 168 hours. No results for input(s): AMMONIA in the last 168 hours. Coagulation Profile: No results for input(s): INR, PROTIME in the last 168 hours. Cardiac Enzymes: No results for input(s): CKTOTAL, CKMB, CKMBINDEX, TROPONINI in the last 168 hours. BNP (last 3 results) No results for input(s): PROBNP in the last 8760 hours. HbA1C: No results for input(s): HGBA1C in the last 72 hours. CBG: Recent Labs  Lab 02/15/20 0837 02/15/20 1358 02/15/20 1701 02/15/20 2121 02/16/20 0755  GLUCAP 132* 204* 192* 172* 188*   Lipid Profile: No results for input(s): CHOL, HDL, LDLCALC, TRIG, CHOLHDL, LDLDIRECT in the last 72 hours. Thyroid Function Tests: No results for input(s): TSH, T4TOTAL, FREET4, T3FREE, THYROIDAB in the last 72 hours. Anemia Panel: No results for input(s): VITAMINB12, FOLATE,  FERRITIN, TIBC, IRON, RETICCTPCT in the last 72 hours. Sepsis Labs: No results for input(s): PROCALCITON, LATICACIDVEN in the last 168 hours.  No results found for this or any previous visit (from the past 240 hour(s)).       Radiology Studies: DG Chest Port 1 View  Result Date: 02/15/2020 CLINICAL DATA:  Hypoxia.  History of hypertension. EXAM: PORTABLE CHEST 1 VIEW COMPARISON:  01/31/2020 FINDINGS: Patchy areas of airspace opacity are noted, similar on the left, more conspicuous on the right, than the previous exam. These are mostly in the mid and lower lungs. Findings are accentuated by low lung volumes. No convincing pleural effusion and no pneumothorax. Cardiac silhouette is normal in size. No mediastinal or hilar masses. Skeletal structures are grossly intact. IMPRESSION: 1. Patchy bilateral airspace lung opacities suspicious for multifocal pneumonia. Electronically Signed   By: Lajean Manes M.D.   On: 02/15/2020 17:41        Scheduled Meds: . amiodarone  400 mg Oral BID  . apixaban  5 mg Oral BID  . vitamin C  500 mg Oral Daily  . aspirin EC  81 mg Oral Daily  . baricitinib  4 mg Oral Daily  . diltiazem  90 mg Oral Q6H  . famotidine  20 mg Oral BID  . feeding supplement  237 mL Oral BID BM  . FLUoxetine  40 mg Oral QHS  . guaiFENesin  600 mg Oral BID  . insulin aspart  0-15 Units Subcutaneous TID WC  . insulin aspart  0-5 Units Subcutaneous QHS  . insulin aspart  3 Units Subcutaneous TID WC  . insulin glargine  11 Units Subcutaneous Daily  . Ipratropium-Albuterol  1 puff Inhalation QID  . methylPREDNISolone (SOLU-MEDROL) injection  60 mg Intravenous Q12H  . metoCLOPramide (REGLAN) injection  10 mg Intravenous Q8H  . metoprolol tartrate  25 mg Oral BID  . multivitamin with minerals  1 tablet Oral Daily  . senna-docusate  1 tablet Oral BID  . zinc sulfate  220 mg Oral Daily   Continuous Infusions:    LOS: 12 days    Time spent: 15 minutes    Sidney Ace, MD [facility] Pager [phone]  If 7PM-7AM, please contact night-coverage 02/16/2020, 11:16 AM

## 2020-02-16 NOTE — Progress Notes (Signed)
Initial Nutrition Assessment  DOCUMENTATION CODES:   Not applicable  INTERVENTION:   Ensure Enlive po TID, each supplement provides 350 kcal and 20 grams of protein  Magic cup TID with meals, each supplement provides 290 kcal and 9 grams of protein  MVI daily   Liberalize diet   Recommend appetite stimulant   NUTRITION DIAGNOSIS:   Increased nutrient needs related to catabolic illness (COVID 19) as evidenced by estimated needs.  GOAL:   Patient will meet greater than or equal to 90% of their needs  MONITOR:   PO intake, Supplement acceptance, Labs, Weight trends, Skin, I & O's  REASON FOR ASSESSMENT:   LOS    ASSESSMENT:   59 y.o. hispanic male with a known history of depression and hypertension who is admitted with COVID 19 PNA and Afib  Attempted to call patient with interpreter [phone]; pt did not answer phone. Per chart review, pt with poor appetite and oral intake hospital; pt eating 25% of meals. Pt refusing supplements today as he reports he is nauseated. Per chart, pt is having some depression. Pt is down 15lbs(8%) since admit; this is significant weight loss. Spoke with MD regarding pt's nutritional status. RD will increase Ensure to three times daily and add Magic Cups to meal trays. RD will also liberalize the heart healthy portion of pt's diet as this restricts protein. MD will start remeron today to help and improve pt's oral intake. May need to consider nasogastric tube placement and nutrition support if pt's oral intake does not improve. Pt is likely at refeed risk.    Medications reviewed and include: vitamin C, aspirin, pepcid, insulin, solu-medrol, reglan, MVI, senokot, zinc  Labs reviewed: BUN 45(H) Wbc- 27.0(H)- 12/4 cbgs- 188, 180 x 24hrs AIC 6.5(H)- 11/28  NUTRITION - FOCUSED PHYSICAL EXAM: Unable to perform at this time   Diet Order:   Diet Order            Diet Heart Room service appropriate? Yes; Fluid consistency: Thin  Diet effective  now                EDUCATION NEEDS:   Education needs have been addressed  Skin:  Skin Assessment: Reviewed RN Assessment  Last BM:  12/5- type 2  Height:   Ht Readings from Last 1 Encounters:  02/05/20 5' 9.5" (1.765 m)    Weight:   Wt Readings from Last 1 Encounters:  02/16/20 85.6 kg    Ideal Body Weight:  75.45 kg  BMI:  Body mass index is 27.48 kg/m.  Estimated Nutritional Needs:   Kcal:  2200-2500kcal/day  Protein:  105-120g/day  Fluid:  >2.2L/day  Koleen Distance MS, RD, LDN Please refer to [facility] for RD and/or RD on-call/weekend/after hours pager

## 2020-02-16 NOTE — Progress Notes (Signed)
[facility] Cardiology    SUBJECTIVE: Generalized fatigue weakness limited conversation patient Spanish-speaking has significant dyspnea and has difficulty answering questions   Vitals:   02/16/20 0446 02/16/20 0519 02/16/20 0600 02/16/20 0757  BP: 112/79 118/81 113/79 114/80  Pulse: 86   (!) 104  Resp: 20   (!) 21  Temp: (!) 97.5 F (36.4 C)   98 F (36.7 C)  TempSrc: Oral     SpO2: 95%   91%  Weight:      Height:         Intake/Output Summary (Last 24 hours) at 02/16/2020 1009 Last data filed at 02/16/2020 0300 Gross per 24 hour  Intake 240 ml  Output 1620 ml  Net -1380 ml      PHYSICAL EXAM  General: Toxic appearance well developed, well nourished, persistent respiratory distress HEENT:  Normocephalic and atramatic Neck:  No JVD.  Lungs: Bilateral diffuse rhonchi  to auscultation and percussion. Heart: Irregular irregular. Normal S1 and S2 without gallops or murmurs.  Abdomen: Bowel sounds are positive, abdomen soft and non-tender  Msk:  Back normal, normal gait. Normal strength and tone for age. Extremities: No clubbing, cyanosis or edema.   Neuro: .  Slightly confused lethargic Spanish-speaking Psych:  Good affect, responds appropriately   LABS: Basic Metabolic Panel: Recent Labs    02/14/20 0438 02/15/20 0718  NA 140 135  K 4.5 4.7  CL 96* 97*  CO2 30 28  GLUCOSE 176* 135*  BUN 43* 45*  CREATININE 1.19 1.13  CALCIUM 8.5* 8.4*   Liver Function Tests: No results for input(s): AST, ALT, ALKPHOS, BILITOT, PROT, ALBUMIN in the last 72 hours. No results for input(s): LIPASE, AMYLASE in the last 72 hours. CBC: Recent Labs    02/14/20 0438  WBC 27.0*  NEUTROABS 24.9*  HGB 16.0  HCT 49.0  MCV 97.6  PLT 646*   Cardiac Enzymes: No results for input(s): CKTOTAL, CKMB, CKMBINDEX, TROPONINI in the last 72 hours. BNP: Invalid input(s): POCBNP D-Dimer: No results for input(s): DDIMER in the last 72 hours. Hemoglobin A1C: No results for input(s): HGBA1C in  the last 72 hours. Fasting Lipid Panel: No results for input(s): CHOL, HDL, LDLCALC, TRIG, CHOLHDL, LDLDIRECT in the last 72 hours. Thyroid Function Tests: No results for input(s): TSH, T4TOTAL, T3FREE, THYROIDAB in the last 72 hours.  Invalid input(s): FREET3 Anemia Panel: No results for input(s): VITAMINB12, FOLATE, FERRITIN, TIBC, IRON, RETICCTPCT in the last 72 hours.  DG Chest Port 1 View  Result Date: 02/15/2020 CLINICAL DATA:  Hypoxia.  History of hypertension. EXAM: PORTABLE CHEST 1 VIEW COMPARISON:  02/02/2020 FINDINGS: Patchy areas of airspace opacity are noted, similar on the left, more conspicuous on the right, than the previous exam. These are mostly in the mid and lower lungs. Findings are accentuated by low lung volumes. No convincing pleural effusion and no pneumothorax. Cardiac silhouette is normal in size. No mediastinal or hilar masses. Skeletal structures are grossly intact. IMPRESSION: 1. Patchy bilateral airspace lung opacities suspicious for multifocal pneumonia. Electronically Signed   By: Lajean Manes M.D.   On: 02/15/2020 17:41       TELEMETRY: Atrial fibrillation rapid ventricular response:  ASSESSMENT AND PLAN:  Active Problems:   Atypical pneumonia COVID-19 pneumonia Respiratory failure Atrial fibrillation rapid ventricular response Dyspnea persistent Generalized weakness Covid sepsis . Continue supplemental oxygen Continue inhalers Agree with supportive care for sepsis Rate control amiodarone metoprolol digoxin Continue general supportive care       Alfred Clements D Alfred Housley,  MD 02/16/2020 10:09 AM

## 2020-02-16 NOTE — Progress Notes (Addendum)
Pt is requesting something to help with anxiety. Notify NP Randol Kern. Will continue to monitor.  Update 0019: NP ordered alprazolam (xanax) 0.25 mg tablet at bedtime. PRN for anxiety. Will continue to monitor

## 2020-02-17 LAB — GLUCOSE, CAPILLARY
Glucose-Capillary: 163 mg/dL — ABNORMAL HIGH (ref 70–99)
Glucose-Capillary: 286 mg/dL — ABNORMAL HIGH (ref 70–99)
Glucose-Capillary: 114 mg/dL — ABNORMAL HIGH (ref 70–99)
Glucose-Capillary: 63 mg/dL — ABNORMAL LOW (ref 70–99)
Glucose-Capillary: 91 mg/dL (ref 70–99)

## 2020-02-17 LAB — CBC
HCT: 46 % (ref 39.0–52.0)
Hemoglobin: 15.3 g/dL (ref 13.0–17.0)
MCH: 32.4 pg (ref 26.0–34.0)
MCHC: 33.3 g/dL (ref 30.0–36.0)
MCV: 97.5 fL (ref 80.0–100.0)
Platelets: 488 10*3/uL — ABNORMAL HIGH (ref 150–400)
RBC: 4.72 MIL/uL (ref 4.22–5.81)
RDW: 12.5 % (ref 11.5–15.5)
WBC: 30.5 10*3/uL — ABNORMAL HIGH (ref 4.0–10.5)
nRBC: 0 % (ref 0.0–0.2)

## 2020-02-17 MED ORDER — PREDNISONE 20 MG PO TABS
40.0000 mg | ORAL_TABLET | Freq: Every day | ORAL | Status: DC
  Administered 2020-02-18: 40 mg via ORAL
  Filled 2020-02-17: qty 2

## 2020-02-17 MED ORDER — DILTIAZEM HCL 30 MG PO TABS
60.0000 mg | ORAL_TABLET | Freq: Four times a day (QID) | ORAL | Status: DC
  Administered 2020-02-17 – 2020-02-21 (×15): 60 mg via ORAL
  Filled 2020-02-17 (×15): qty 2

## 2020-02-17 MED ORDER — DIGOXIN 0.25 MG/ML IJ SOLN
0.2500 mg | Freq: Once | INTRAMUSCULAR | Status: AC
  Administered 2020-02-17: 0.25 mg via INTRAVENOUS
  Filled 2020-02-17: qty 2

## 2020-02-17 MED ORDER — METOPROLOL TARTRATE 50 MG PO TABS
50.0000 mg | ORAL_TABLET | Freq: Two times a day (BID) | ORAL | Status: DC
  Administered 2020-02-17 – 2020-02-21 (×8): 50 mg via ORAL
  Filled 2020-02-17 (×8): qty 1

## 2020-02-17 MED ORDER — DIGOXIN 0.25 MG/ML IJ SOLN
0.1250 mg | Freq: Every day | INTRAMUSCULAR | Status: DC
  Administered 2020-02-18 – 2020-02-21 (×4): 0.125 mg via INTRAVENOUS
  Filled 2020-02-17 (×4): qty 2

## 2020-02-17 MED ORDER — DIGOXIN 0.25 MG/ML IJ SOLN
0.5000 mg | Freq: Once | INTRAMUSCULAR | Status: AC
  Administered 2020-02-17: 0.5 mg via INTRAVENOUS
  Filled 2020-02-17: qty 2

## 2020-02-17 MED ORDER — METOPROLOL TARTRATE 25 MG PO TABS
25.0000 mg | ORAL_TABLET | Freq: Once | ORAL | Status: AC
  Administered 2020-02-17: 25 mg via ORAL
  Filled 2020-02-17: qty 1

## 2020-02-17 NOTE — Progress Notes (Signed)
PROGRESS NOTE    Alfred Clements  CNO:[id number] DOB: [birth year] DOA: 02/03/2020 PCP: Baxter Hire, MD    Brief Narrative:  FernandoGarciais a59 y.o.Hispanic malewith a known history of depression and hypertension, who presented to the emergency room with acute onset of dyspnea and feveras well as dry cough and wheezing since Monday. He admits to fever and chills as well as loss of taste and smell. He has been having fatigue and tiredness. He admitted to diarrhea with bright red bleeding per rectum or melena. No nausea or vomiting.He has been exposed to hisson who wasdiagnosed with COVID-19 a couple days ago. 2 daughters also have similar symptoms. The patient was noted to have hypoxemia with pulseoximetryhas been ranging 88 to 89% on room air. The patient has not been vaccinated for COVID-19. As of 02/11/2020 patient remains in progressive cardiac unit dependent on heated high flow nasal cannula.  He is mentating clearly though he looks fatigued.  Normal work of breathing.  12/2: Patient very anxious and tearful this morning.  Repeatedly request to go home.  Explained to him in Spanish that he was markedly hypoxic and that discharge home at this time would not be safe in any way.  He expressed understanding. 12/3: Oxygen status improved over interval.  Wean from heated high flow nasal cannula to high flow nasal cannula at 15 L.  Still anxious but less.  Remains in rapid atrial fibrillation 12/4: Oxygen rate weaned to 11 L.  Patient seems very depressed and anxious still.  Has been unable to speak to his wife for unclear reasons.  I asked the nurse to put them in touch. 12/5: Oxygen rate at 10 L.  Continues to be depressed.  Long conversation with the patient's wife via phone today.  Reassured her that she continues to make daily progress as far as respiratory status goes.  Patient remains very depressed. 12/6: Oxygen remained at 10 to 11 L. Continues to express depression and  anxiety. Remains in rapid atrial fibrillation. 12/7: Oxygen remains at 8 L nasal cannula.  Respiratory status slowly improving.  In rapid atrial fibrillation.  Digoxin added by cardiology today.   Assessment & Plan:   Active Problems:   Atypical pneumonia  Multifocal pneumonia secondary to COVID-19 Severe acute hypoxic respiratory failure secondary to above Oxygen saturation on admission 86% Oxygen requirement has gone up substantially since then Completed course of remdesivir in house Weaned off heated high flow nasal cannula 12/7: Weaned to 8 L high flow Plan: DC Solu-Medrol Start p.o. prednisone 40 mg for tomorrow Maintaining steroids past the 10-day course given severity and persistence of symptoms Follow daily inflammatory markers, overall downtrending Continue baricitinib Stress I-S and flutter use Out of bed to chair as tolerated Combivent 4 times daily Wean oxygen as tolerated  Acute anxiety/depression 02/12/20 patient very tearful and anxious Wishes to go home Requests medication for antianxiety Wife is concerned that medication could be worsening or causing delirium Plan: DC as needed Xanax  Add Atarax 25 mg p.o. every 6 hours as needed Frequent reassurance  Atrial fibrillation with rapid ventricular response No documented history of arrhythmia Development possibly due to Covid infection and hypoxia Cardiology following Rate remains uncontrolled Plan: Amiodarone 400 mg p.o. twice daily Continue Cardizem 60 mg every 6 hours Metoprolol added by cardiology Digoxin added by cardiology Eliquis for anticoagulation Telemetry monitoring Cardiology follow-up Still not recommending cardioversion  Hypokalemia Monitor and replace as needed Maintain K greater than 4, mag greater than 2  Hyponatremia, resolved  Monitor  Essential hypertension Not taking any home medications Continue continue diltiazem as above  Depression Continue home  Prozac  GERD Pepcid  Hyperglycemia No history of DM Likely due to steroids Continue Accu-Cheks and sliding scale insulin  DVT prophylaxis: Lovenox Code Status: Full Family Communication: Wife Alfred Clements [phone] on 02/17/2020 Disposition Plan: Status is: Inpatient  Remains inpatient appropriate because:Inpatient level of care appropriate due to severity of illness   Dispo: The patient is from: Home              Anticipated d/c is to: Home              Anticipated d/c date is: 2 days              Patient currently is not medically stable to d/c.   Improving overall.  Remains hypoxic requiring 8 L high flow nasal cannula.  Remains in rapid atrial fibrillation.  Rate slowly improving with addition of medications by cardiology.  Consultants:   Cardiology-[facility]  Procedures:   None  Antimicrobials:   None   Subjective: Patient seen and examined.  Continues to be very depressed.  Oxygen level improving.  On 8 L high flow nasal cannula.  Remains in atrial fibrillation  Objective: Vitals:   02/17/20 0100 02/17/20 0354 02/17/20 0815 02/17/20 1200  BP: (!) 114/93 104/84 116/80 106/87  Pulse: (!) 116 (!) 45 96 65  Resp: (!) 21 20 18 17   Temp: 98.4 F (36.9 C) 98.6 F (37 C) 98.3 F (36.8 C) 97.7 F (36.5 C)  TempSrc: Oral     SpO2: 92% 93% 93% 95%  Weight:  85.3 kg    Height:        Intake/Output Summary (Last 24 hours) at 02/17/2020 1352 Last data filed at 02/17/2020 0930 Gross per 24 hour  Intake 720 ml  Output 1285 ml  Net -565 ml   Filed Weights   02/15/20 0458 02/16/20 0414 02/17/20 0354  Weight: 86.2 kg 85.6 kg 85.3 kg    Examination:  General exam: Appears depressed Respiratory system: Bibasilar crackles.  Normal work of breathing.  10 L cardiovascular system: Tachycardic, irregular rhythm, no murmurs, no pedal edema  gastrointestinal system: Abdomen is nondistended, soft and nontender. No organomegaly or masses felt. Normal bowel  sounds heard. Central nervous system: Alert and oriented. No focal neurological deficits. Extremities: Symmetric 5 x 5 power. Skin: No rashes, lesions or ulcers Psychiatry: Judgement and insight appear impaired. Mood & affect anxious.   Data Reviewed: I have personally reviewed following labs and imaging studies  CBC: Recent Labs  Lab 02/11/20 0426 02/12/20 0733 02/13/20 0428 02/14/20 0438 02/17/20 0346  WBC 19.6* 21.7* 18.8* 27.0* 30.5*  NEUTROABS  --  19.2* 16.8* 24.9*  --   HGB 15.6 15.3 15.1 16.0 15.3  HCT 48.1 46.5 46.5 49.0 46.0  MCV 97.8 97.1 99.1 97.6 97.5  PLT 607* 701* 653* 646* 824*   Basic Metabolic Panel: Recent Labs  Lab 02/11/20 0426 02/12/20 0733 02/13/20 0428 02/14/20 0438 02/15/20 0718  NA 137 137 139 140 135  K 4.4 4.3 4.4 4.5 4.7  CL 98 98 96* 96* 97*  CO2 28 27 32 30 28  GLUCOSE 265* 211* 159* 176* 135*  BUN 39* 35* 40* 43* 45*  CREATININE 1.30* 1.05 1.18 1.19 1.13  CALCIUM 8.6* 8.5* 8.6* 8.5* 8.4*  MG 2.7*  --   --   --   --    GFR: Estimated Creatinine Clearance: 69.8  mL/min (by C-G formula based on SCr of 1.13 mg/dL). Liver Function Tests: No results for input(s): AST, ALT, ALKPHOS, BILITOT, PROT, ALBUMIN in the last 168 hours. No results for input(s): LIPASE, AMYLASE in the last 168 hours. No results for input(s): AMMONIA in the last 168 hours. Coagulation Profile: No results for input(s): INR, PROTIME in the last 168 hours. Cardiac Enzymes: No results for input(s): CKTOTAL, CKMB, CKMBINDEX, TROPONINI in the last 168 hours. BNP (last 3 results) No results for input(s): PROBNP in the last 8760 hours. HbA1C: No results for input(s): HGBA1C in the last 72 hours. CBG: Recent Labs  Lab 02/16/20 1155 02/16/20 1655 02/16/20 2001 02/17/20 0811 02/17/20 1158  GLUCAP 180* 113* 80 163* 286*   Lipid Profile: No results for input(s): CHOL, HDL, LDLCALC, TRIG, CHOLHDL, LDLDIRECT in the last 72 hours. Thyroid Function Tests: No results for  input(s): TSH, T4TOTAL, FREET4, T3FREE, THYROIDAB in the last 72 hours. Anemia Panel: No results for input(s): VITAMINB12, FOLATE, FERRITIN, TIBC, IRON, RETICCTPCT in the last 72 hours. Sepsis Labs: No results for input(s): PROCALCITON, LATICACIDVEN in the last 168 hours.  No results found for this or any previous visit (from the past 240 hour(s)).       Radiology Studies: DG Chest Port 1 View  Result Date: 02/15/2020 CLINICAL DATA:  Hypoxia.  History of hypertension. EXAM: PORTABLE CHEST 1 VIEW COMPARISON:  02/07/2020 FINDINGS: Patchy areas of airspace opacity are noted, similar on the left, more conspicuous on the right, than the previous exam. These are mostly in the mid and lower lungs. Findings are accentuated by low lung volumes. No convincing pleural effusion and no pneumothorax. Cardiac silhouette is normal in size. No mediastinal or hilar masses. Skeletal structures are grossly intact. IMPRESSION: 1. Patchy bilateral airspace lung opacities suspicious for multifocal pneumonia. Electronically Signed   By: Lajean Manes M.D.   On: 02/15/2020 17:41        Scheduled Meds: . amiodarone  400 mg Oral BID  . apixaban  5 mg Oral BID  . vitamin C  500 mg Oral Daily  . aspirin EC  81 mg Oral Daily  . baricitinib  4 mg Oral Daily  . [START ON 02/18/2020] digoxin  0.125 mg Intravenous Daily  . digoxin  0.25 mg Intravenous Once  . diltiazem  60 mg Oral Q6H  . famotidine  20 mg Oral BID  . feeding supplement  237 mL Oral TID BM  . FLUoxetine  40 mg Oral QHS  . guaiFENesin  600 mg Oral BID  . insulin aspart  0-15 Units Subcutaneous TID WC  . insulin aspart  0-5 Units Subcutaneous QHS  . insulin aspart  3 Units Subcutaneous TID WC  . insulin glargine  11 Units Subcutaneous Daily  . Ipratropium-Albuterol  1 puff Inhalation QID  . methylPREDNISolone (SOLU-MEDROL) injection  60 mg Intravenous Q12H  . metoCLOPramide (REGLAN) injection  10 mg Intravenous Q8H  . metoprolol tartrate  50 mg  Oral BID  . mirtazapine  15 mg Oral QHS  . multivitamin with minerals  1 tablet Oral Daily  . senna-docusate  1 tablet Oral BID  . zinc sulfate  220 mg Oral Daily   Continuous Infusions:    LOS: 13 days    Time spent: 15 minutes    Sidney Ace, MD [facility] Pager [phone]  If 7PM-7AM, please contact night-coverage 02/17/2020, 1:52 PM

## 2020-02-17 NOTE — Progress Notes (Signed)
[facility] Cardiology    SUBJECTIVE: Resting comfortably in room still short of breath still fatigued denies any pain no significant fever chills or sweats   Vitals:   02/16/20 1955 02/17/20 0100 02/17/20 0354 02/17/20 0815  BP: 119/71 (!) 114/93 104/84 116/80  Pulse: (!) 47 (!) 116 (!) 45 96  Resp: 20 (!) 21 20 18   Temp: 98.3 F (36.8 C) 98.4 F (36.9 C) 98.6 F (37 C) 98.3 F (36.8 C)  TempSrc:  Oral    SpO2: (!) 84% 92% 93% 93%  Weight:   85.3 kg   Height:         Intake/Output Summary (Last 24 hours) at 02/17/2020 1120 Last data filed at 02/17/2020 0930 Gross per 24 hour  Intake 960 ml  Output 1485 ml  Net -525 ml      PHYSICAL EXAM  General: Well developed, well nourished, in no acute distress HEENT:  Normocephalic and atramatic Neck:  No JVD.  Lungs: Clear bilaterally to auscultation and percussion. Heart: HRRR . Normal S1 and S2 without gallops or murmurs.  Abdomen: Bowel sounds are positive, abdomen soft and non-tender  Msk:  Back normal, normal gait. Normal strength and tone for age. Extremities: No clubbing, cyanosis or edema.   Neuro: Alert and oriented X 3. Psych:  Good affect, responds appropriately   LABS: Basic Metabolic Panel: Recent Labs    02/15/20 0718  NA 135  K 4.7  CL 97*  CO2 28  GLUCOSE 135*  BUN 45*  CREATININE 1.13  CALCIUM 8.4*   Liver Function Tests: No results for input(s): AST, ALT, ALKPHOS, BILITOT, PROT, ALBUMIN in the last 72 hours. No results for input(s): LIPASE, AMYLASE in the last 72 hours. CBC: Recent Labs    02/17/20 0346  WBC 30.5*  HGB 15.3  HCT 46.0  MCV 97.5  PLT 488*   Cardiac Enzymes: No results for input(s): CKTOTAL, CKMB, CKMBINDEX, TROPONINI in the last 72 hours. BNP: Invalid input(s): POCBNP D-Dimer: No results for input(s): DDIMER in the last 72 hours. Hemoglobin A1C: No results for input(s): HGBA1C in the last 72 hours. Fasting Lipid Panel: No results for input(s): CHOL, HDL, LDLCALC, TRIG,  CHOLHDL, LDLDIRECT in the last 72 hours. Thyroid Function Tests: No results for input(s): TSH, T4TOTAL, T3FREE, THYROIDAB in the last 72 hours.  Invalid input(s): FREET3 Anemia Panel: No results for input(s): VITAMINB12, FOLATE, FERRITIN, TIBC, IRON, RETICCTPCT in the last 72 hours.  DG Chest Port 1 View  Result Date: 02/15/2020 CLINICAL DATA:  Hypoxia.  History of hypertension. EXAM: PORTABLE CHEST 1 VIEW COMPARISON:  02/03/2020 FINDINGS: Patchy areas of airspace opacity are noted, similar on the left, more conspicuous on the right, than the previous exam. These are mostly in the mid and lower lungs. Findings are accentuated by low lung volumes. No convincing pleural effusion and no pneumothorax. Cardiac silhouette is normal in size. No mediastinal or hilar masses. Skeletal structures are grossly intact. IMPRESSION: 1. Patchy bilateral airspace lung opacities suspicious for multifocal pneumonia. Electronically Signed   By: Lajean Manes M.D.   On: 02/15/2020 17:41       TELEMETRY: Atrial fibrillation rate of 130  ASSESSMENT AND PLAN:  Active Problems:   Atypical pneumonia Covid pneumonia A. fib rapid ventricular response Shortness of breath Generalized weakness Hypoxemia  Plan Continue telemetry Continue supplemental oxygen rate monitoring Agree with aggressive respiratory support for pneumonia Amiodarone load for A. fib Cardizem metoprolol for rate We will add digoxin to help with rate management and  control Anticoagulation for anticoagulation for A. fib Eliquis 5 mg twice a day Agree with insulin therapy for diabetes management NovoLog Lantus  Agree with steroid therapy for COVID-19 pneumonia     Yolonda Kida, MD 02/17/2020 11:20 AM

## 2020-02-17 NOTE — Progress Notes (Signed)
Occupational Therapy Treatment Patient Details Name: Alfred Clements MRN: [id number] DOB: 1960-10-29 Today's Date: 02/17/2020    History of present illness Alfred Clements  is a 59 y.o. Hispanic male with a known history of depression and hypertension, who presented to the emergency room with acute onset of dyspnea and fever as well as dry cough and wheezing. COVID positive with spesis related to COVID, COVID-19 pneumonia, generalized weakness and fatigue, rapid atrial fibrilation.    OT comments  Upon entering the room, pt supine in bed and on 8 L O2 throughout session. [employer] interpreter utilized this session secondary to pt being spanish speaking. Pt verbalized pain in stomach and nausea. RN notified and reports having just provided pt with medication. Pt is agreeable to B UE strengthening exercises via bed level. OT educated and demonstrated use of level 2 resistive theraband. Pt returning demonstrations for set of 10 chest pulls, shoulder diagonals, shoulder elevation, alternating punches, and bicep curls. Pt needing min cuing for proper technique. Pt's O2 saturation decreased to 86%, HR increased to 115 bpm, and respiratory rate 30-35. Pt needing min cuing for pursed lip breathing exercises. OT also providing pt with incentive spirometer and provided min cuing for pt to correctly perform.   Follow Up Recommendations  Home health OT;Supervision/Assistance - 24 hour    Equipment Recommendations  3 in 1 bedside commode       Precautions / Restrictions Precautions Precautions: Fall       Mobility Bed Mobility Overal bed mobility: Needs Assistance Bed Mobility: Rolling Rolling: Supervision         General bed mobility comments: verbal cuing for bed mobility to reposition self higher in bed  Transfers     General transfer comment: deferred secondary to nausea        ADL either performed or assessed with clinical judgement        Vision Patient Visual Report: No change  from baseline            Cognition Arousal/Alertness: Awake/alert Behavior During Therapy: Flat affect Overall Cognitive Status: Within Functional Limits for tasks assessed                      Pertinent Vitals/ Pain       Pain Assessment: Faces Faces Pain Scale: Hurts a little bit Pain Location: nausea -points to stomach Pain Descriptors / Indicators: Other (Comment) (nausea) Pain Intervention(s): Limited activity within patient's tolerance;Monitored during session;Premedicated before session  Home Living Family/patient expects to be discharged to:: Private residence Living Arrangements: Spouse/significant other;Children             Frequency  Min 2X/week        Progress Toward Goals  OT Goals(current goals can now be found in the care plan section)  Progress towards OT goals: Progressing toward goals  Acute Rehab OT Goals Patient Stated Goal: to go home  OT Goal Formulation: With patient Time For Goal Achievement: 02/29/20 Potential to Achieve Goals: Good  Plan Discharge plan remains appropriate       AM-PAC OT "6 Clicks" Daily Activity     Outcome Measure   Help from another person eating meals?: None Help from another person taking care of personal grooming?: A Little Help from another person toileting, which includes using toliet, bedpan, or urinal?: A Little Help from another person bathing (including washing, rinsing, drying)?: A Little Help from another person to put on and taking off regular upper body clothing?: A Little Help from another  person to put on and taking off regular lower body clothing?: A Little 6 Click Score: 19    End of Session Equipment Utilized During Treatment: Oxygen (8 L via [place] O2)  OT Visit Diagnosis: Other abnormalities of gait and mobility (R26.89)   Activity Tolerance Patient tolerated treatment well;Patient limited by fatigue   Patient Left in bed;with call bell/phone within reach;with bed alarm set   Nurse  Communication Mobility status        Time: 6384-5364 OT Time Calculation (min): 38 min  Charges: OT General Charges $OT Visit: 1 Visit OT Treatments $Therapeutic Exercise: 38-52 mins  Darleen Crocker, MS, OTR/L , CBIS ascom [phone]  02/17/20, 2:40 PM

## 2020-02-18 ENCOUNTER — Inpatient Hospital Stay: Payer: Medicare Other

## 2020-02-18 LAB — CBC WITH DIFFERENTIAL/PLATELET
Abs Immature Granulocytes: 0.15 10*3/uL — ABNORMAL HIGH (ref 0.00–0.07)
Basophils Absolute: 0.1 10*3/uL (ref 0.0–0.1)
Basophils Relative: 0 %
Eosinophils Absolute: 0 10*3/uL (ref 0.0–0.5)
Eosinophils Relative: 0 %
HCT: 44.5 % (ref 39.0–52.0)
Hemoglobin: 14.5 g/dL (ref 13.0–17.0)
Immature Granulocytes: 1 %
Lymphocytes Relative: 2 %
Lymphs Abs: 0.4 10*3/uL — ABNORMAL LOW (ref 0.7–4.0)
MCH: 31.4 pg (ref 26.0–34.0)
MCHC: 32.6 g/dL (ref 30.0–36.0)
MCV: 96.3 fL (ref 80.0–100.0)
Monocytes Absolute: 0.7 10*3/uL (ref 0.1–1.0)
Monocytes Relative: 3 %
Neutro Abs: 25.2 10*3/uL — ABNORMAL HIGH (ref 1.7–7.7)
Neutrophils Relative %: 94 %
Platelets: 407 10*3/uL — ABNORMAL HIGH (ref 150–400)
RBC: 4.62 MIL/uL (ref 4.22–5.81)
RDW: 12.5 % (ref 11.5–15.5)
Smear Review: NORMAL
WBC: 26.6 10*3/uL — ABNORMAL HIGH (ref 4.0–10.5)
nRBC: 0 % (ref 0.0–0.2)

## 2020-02-18 LAB — GLUCOSE, CAPILLARY
Glucose-Capillary: 123 mg/dL — ABNORMAL HIGH (ref 70–99)
Glucose-Capillary: 185 mg/dL — ABNORMAL HIGH (ref 70–99)
Glucose-Capillary: 106 mg/dL — ABNORMAL HIGH (ref 70–99)
Glucose-Capillary: 93 mg/dL (ref 70–99)

## 2020-02-18 LAB — PROCALCITONIN: Procalcitonin: 0.2 ng/mL

## 2020-02-18 LAB — BASIC METABOLIC PANEL
Anion gap: 10 (ref 5–15)
BUN: 49 mg/dL — ABNORMAL HIGH (ref 8–23)
CO2: 27 mmol/L (ref 22–32)
Calcium: 8.4 mg/dL — ABNORMAL LOW (ref 8.9–10.3)
Chloride: 99 mmol/L (ref 98–111)
Creatinine, Ser: 1.3 mg/dL — ABNORMAL HIGH (ref 0.61–1.24)
GFR, Estimated: >60 mL/min (ref >60)
Glucose, Bld: 91 mg/dL (ref 70–99)
Potassium: 4.5 mmol/L (ref 3.5–5.1)
Sodium: 136 mmol/L (ref 135–145)

## 2020-02-18 LAB — BRAIN NATRIURETIC PEPTIDE: B Natriuretic Peptide: 157.3 pg/mL — ABNORMAL HIGH (ref 0.0–100.0)

## 2020-02-18 LAB — LACTIC ACID, PLASMA
Lactic Acid, Venous: 2.8 mmol/L (ref 0.5–1.9)
Lactic Acid, Venous: 3.2 mmol/L (ref 0.5–1.9)

## 2020-02-18 LAB — C-REACTIVE PROTEIN: CRP: 12.6 mg/dL — ABNORMAL HIGH (ref <1.0)

## 2020-02-18 LAB — MAGNESIUM: Magnesium: 2.7 mg/dL — ABNORMAL HIGH (ref 1.7–2.4)

## 2020-02-18 MED ORDER — SODIUM CHLORIDE 0.9 % IV SOLN: INTRAVENOUS | Status: AC

## 2020-02-18 MED ORDER — AMIODARONE HCL 200 MG PO TABS
200.0000 mg | ORAL_TABLET | Freq: Two times a day (BID) | ORAL | Status: DC
  Administered 2020-02-18 – 2020-02-21 (×6): 200 mg via ORAL
  Filled 2020-02-18 (×6): qty 1

## 2020-02-18 MED ORDER — METHYLPREDNISOLONE SODIUM SUCC 40 MG IJ SOLR
40.0000 mg | Freq: Three times a day (TID) | INTRAMUSCULAR | Status: DC
  Administered 2020-02-19 – 2020-02-20 (×5): 40 mg via INTRAVENOUS
  Filled 2020-02-18 (×5): qty 1

## 2020-02-18 NOTE — Progress Notes (Signed)
PT Cancellation Note  Patient Details Name: Alfred Clements MRN: [id number] DOB: December 02, 1960   Cancelled Treatment:    Reason Eval/Treat Not Completed: Other (comment);Medical issues which prohibited therapy. Pt with elevated HR this AM (telemetry reading in 140s) RN aware and reported recent administration of medication. PT to hold until pt is more medically appropriate for exertional activities.   Lieutenant Diego PT, DPT 11:08 AM,02/18/20

## 2020-02-18 NOTE — Progress Notes (Signed)
Physical Therapy Treatment Patient Details Name: Alfred Clements MRN: [id number] DOB: 1960-03-14 Today's Date: 02/18/2020    History of Present Illness Alfred Clements  is a 59 y.o. Hispanic male with a known history of depression and hypertension, who presented to the emergency room with acute onset of dyspnea and fever as well as dry cough and wheezing. COVID positive with spesis related to COVID, COVID-19 pneumonia, generalized weakness and fatigue, rapid atrial fibrilation.     PT Comments    Patient alert, in bed, fatigued but no complaints of pain, just continued nausea.  Pt performed supine to sit with HOB elevated and CGA, unable to tolerate sitting >30seconds due to pt fatigue and lightheadedness, returned to supine. Saturation levels down to low 80s with mobility attempts, with extended time and cues for PLB able to improve to at least 88%. Several supine exercises performed with verbal cues, and PT led rest breaks for desaturation. Pt re-attempted supine to sit at end of session; able to sit a little longer but still limited by fatigue and lightheadedness, returned to resting in bed with all needs in reach. Pt progress limited by symptoms this session, if next attempt pt still limited functionally, discharge plan may need to be updated.    Follow Up Recommendations  Home health PT;Supervision/Assistance - 24 hour     Equipment Recommendations  Rolling walker with 5" wheels    Recommendations for Other Services       Precautions / Restrictions Precautions Precautions: Fall Restrictions Weight Bearing Restrictions: No    Mobility  Bed Mobility Overal bed mobility: Needs Assistance Bed Mobility: Supine to Sit;Sit to Supine     Supine to sit: HOB elevated Sit to supine: HOB elevated      Transfers                 General transfer comment: deferred due to desaturation, dizziness  Ambulation/Gait                 Stairs             Wheelchair  Mobility    Modified Rankin (Stroke Patients Only)       Balance Overall balance assessment: Needs assistance Sitting-balance support: Feet supported;Bilateral upper extremity supported Sitting balance-Leahy Scale: Good         Standing balance comment: deferred standing                            Cognition Arousal/Alertness: Awake/alert Behavior During Therapy: Flat affect Overall Cognitive Status: Within Functional Limits for tasks assessed                                        Exercises General Exercises - Lower Extremity Ankle Circles/Pumps: AROM;Strengthening;Both;Supine;10 reps Quad Sets: AROM;Strengthening;10 reps;Both Gluteal Sets: AROM;Strengthening;10 reps;Both Heel Slides: Strengthening;Both;Supine;10 reps;AROM Hip ABduction/ADduction: AROM;Strengthening;Supine;Both;10 reps    General Comments General comments (skin integrity, edema, etc.): spO2 ranged 82- 93% on HFNC at 15L      Pertinent Vitals/Pain Pain Assessment: No/denies pain    Home Living                      Prior Function            PT Goals (current goals can now be found in the care plan section) Progress towards PT goals: Progressing toward goals (  slowly)    Frequency    Min 2X/week      PT Plan Current plan remains appropriate    Co-evaluation              AM-PAC PT "6 Clicks" Mobility   Outcome Measure  Help needed turning from your back to your side while in a flat bed without using bedrails?: A Little Help needed moving from lying on your back to sitting on the side of a flat bed without using bedrails?: A Little Help needed moving to and from a bed to a chair (including a wheelchair)?: A Little Help needed standing up from a chair using your arms (e.g., wheelchair or bedside chair)?: A Little Help needed to walk in hospital room?: A Lot Help needed climbing 3-5 steps with a railing? : A Lot 6 Click Score: 16    End of  Session Equipment Utilized During Treatment: Oxygen (15L) Activity Tolerance: Patient limited by fatigue;Other (comment) (limited by symptoms) Patient left: in bed;with call bell/phone within reach Nurse Communication: Mobility status PT Visit Diagnosis: Unsteadiness on feet (R26.81);Muscle weakness (generalized) (M62.81)     Time: 8592-7639 PT Time Calculation (min) (ACUTE ONLY): 44 min  Charges:  $Therapeutic Exercise: 38-52 mins                     Lieutenant Diego PT, DPT 2:42 PM,02/18/20

## 2020-02-18 NOTE — Progress Notes (Signed)
PROGRESS NOTE    Alfred Clements  XTG:[id number] DOB: 1960/08/25 DOA: 02/01/2020 PCP: Alfred Hire, MD    Brief Narrative:  FernandoGarciais a59 y.o.Hispanic malewith a known history of depression and hypertension, who presented to the emergency room with acute onset of dyspnea and feveras well as dry cough and wheezing since Monday. He admits to fever and chills as well as loss of taste and smell. He has been having fatigue and tiredness. He admitted to diarrhea with bright red bleeding per rectum or melena. No nausea or vomiting.He has been exposed to hisson who wasdiagnosed with COVID-19 a couple days ago. 2 daughters also have similar symptoms. The patient was noted to have hypoxemia with pulseoximetryhas been ranging 88 to 89% on room air. The patient has not been vaccinated for COVID-19. As of 02/11/2020 patient remains in progressive cardiac unit dependent on heated high flow nasal cannula.  He is mentating clearly though he looks fatigued.  Normal work of breathing.  12/2: Patient very anxious and tearful this morning.  Repeatedly request to go home.  Explained to him in Spanish that he was markedly hypoxic and that discharge home at this time would not be safe in any way.  He expressed understanding. 12/3: Oxygen status improved over interval.  Wean from heated high flow nasal cannula to high flow nasal cannula at 15 L.  Still anxious but less.  Remains in rapid atrial fibrillation 12/4: Oxygen rate weaned to 11 L.  Patient seems very depressed and anxious still.  Has been unable to speak to his wife for unclear reasons.  I asked the nurse to put them in touch. 12/5: Oxygen rate at 10 L.  Continues to be depressed.  Long conversation with the patient's wife via phone today.  Reassured her that she continues to make daily progress as far as respiratory status goes.  Patient remains very depressed. 12/6: Oxygen remained at 10 to 11 L. Continues to express depression and  anxiety. Remains in rapid atrial fibrillation. 12/7: Oxygen remains at 8 L nasal cannula.  Respiratory status slowly improving.  In rapid atrial fibrillation.  Digoxin added by cardiology today.   Assessment & Plan:   Active Problems:   Atypical pneumonia  Multifocal pneumonia secondary to COVID-19 Severe acute hypoxic respiratory failure secondary to above Oxygen saturation on admission 86% Oxygen requirement has gone up substantially since then Completed course of remdesivir in house Weaned off heated high flow nasal cannula 12/7: Weaned to 8 L high flow Plan: --increase steroid back up to solumedrol 40 mg q8h again due to CRP again increasing when steroid tapered down --Monitor and ensure CRP down-trending Continue baricitinib Stress I-S and flutter use Out of bed to chair as tolerated Combivent 4 times daily Wean oxygen as tolerated  Acute anxiety/depression 02/12/20 patient very tearful and anxious Wishes to go home Requests medication for antianxiety Wife is concerned that medication could be worsening or causing delirium, Xanax d/c'ed Plan: --cont atarax PRN --Continue home Prozac  Atrial fibrillation with rapid ventricular response No documented history of arrhythmia Development possibly due to Covid infection and hypoxia Cardiology following --Rate remains uncontrolled Plan: Amiodarone 400 mg p.o. twice daily Continue Cardizem 60 mg every 6 hours --cont Lopressor 50 mg BID --cont digoxin, per cardiology Eliquis for anticoagulation Telemetry monitoring Cardiology follow-up --Still not recommending cardioversion  Hypokalemia --monitor and replete PRN  Hyponatremia, resolved Monitor  Essential hypertension Not taking any home medications --cont dilt and lopressor  GERD Pepcid  Hyperglycemia No history of  DM Likely due to steroids Continue Accu-Cheks and sliding scale insulin   DVT prophylaxis: Lovenox Code Status: Full Family Communication:  Wife Alfred Clements [phone] on 02/17/2020 Disposition Plan: Status is: Inpatient  Remains inpatient appropriate because:Inpatient level of care appropriate due to severity of illness   Dispo: The patient is from: Home              Anticipated d/c is to: Home              Anticipated d/c date is: >3 days              Patient currently is not medically stable to d/c.   Improving overall.  Remains hypoxic requiring 8 L high flow nasal cannula.  Remains in rapid atrial fibrillation.  Rate slowly improving with addition of medications by cardiology.  Consultants:   Cardiology-[facility]  Procedures:   None  Antimicrobials:   None   Subjective: Nursing reported N/V and abdominal pain overnight.  KUB showed mod gaseous distention.  Symptoms improved during the day.     Objective: Vitals:   02/18/20 0825 02/18/20 1200 02/18/20 1600 02/18/20 1652  BP: 109/70 110/88 101/70   Pulse: (!) 126 78 (!) 59   Resp: (!) 22 20 (!) 21   Temp: 97.8 F (36.6 C) 97.7 F (36.5 C)  98.2 F (36.8 C)  TempSrc:  Oral  Oral  SpO2: (!) 89% (!) 88% 97%   Weight:      Height:        Intake/Output Summary (Last 24 hours) at 02/18/2020 1835 Last data filed at 02/18/2020 1655 Gross per 24 hour  Intake 720 ml  Output 500 ml  Net 220 ml   Filed Weights   02/16/20 0414 02/17/20 0354 02/18/20 0327  Weight: 85.6 kg 85.3 kg 84.7 kg    Examination:  Constitutional: NAD, alert, oriented HEENT: conjunctivae and lids normal, EOMI CV: No cyanosis.   RESP: No distress, on 8L GI: abdomen soft, NTND Extremities: No effusions, edema in BLE SKIN: warm, dry and intact Neuro: II - XII grossly intact.   Psych: depressed mood and affect.     Data Reviewed: I have personally reviewed following labs and imaging studies  CBC: Recent Labs  Lab 02/12/20 0733 02/13/20 0428 02/14/20 0438 02/17/20 0346 02/18/20 0436  WBC 21.7* 18.8* 27.0* 30.5* 26.6*  NEUTROABS 19.2* 16.8* 24.9*  --  25.2*   HGB 15.3 15.1 16.0 15.3 14.5  HCT 46.5 46.5 49.0 46.0 44.5  MCV 97.1 99.1 97.6 97.5 96.3  PLT 701* 653* 646* 488* 834*   Basic Metabolic Panel: Recent Labs  Lab 02/12/20 0733 02/13/20 0428 02/14/20 0438 02/15/20 0718 02/18/20 0436 02/18/20 0600  NA 137 139 140 135 136  --   K 4.3 4.4 4.5 4.7 4.5  --   CL 98 96* 96* 97* 99  --   CO2 27 32 30 28 27   --   GLUCOSE 211* 159* 176* 135* 91  --   BUN 35* 40* 43* 45* 49*  --   CREATININE 1.05 1.18 1.19 1.13 1.30*  --   CALCIUM 8.5* 8.6* 8.5* 8.4* 8.4*  --   MG  --   --   --   --   --  2.7*   GFR: Estimated Creatinine Clearance: 60.7 mL/min (A) (by C-G formula based on SCr of 1.3 mg/dL (H)). Liver Function Tests: No results for input(s): AST, ALT, ALKPHOS, BILITOT, PROT, ALBUMIN in the last 168 hours. No results  for input(s): LIPASE, AMYLASE in the last 168 hours. No results for input(s): AMMONIA in the last 168 hours. Coagulation Profile: No results for input(s): INR, PROTIME in the last 168 hours. Cardiac Enzymes: No results for input(s): CKTOTAL, CKMB, CKMBINDEX, TROPONINI in the last 168 hours. BNP (last 3 results) No results for input(s): PROBNP in the last 8760 hours. HbA1C: No results for input(s): HGBA1C in the last 72 hours. CBG: Recent Labs  Lab 02/17/20 1727 02/17/20 2109 02/18/20 0827 02/18/20 1201 02/18/20 1650  GLUCAP 114* 91 123* 185* 106*   Lipid Profile: No results for input(s): CHOL, HDL, LDLCALC, TRIG, CHOLHDL, LDLDIRECT in the last 72 hours. Thyroid Function Tests: No results for input(s): TSH, T4TOTAL, FREET4, T3FREE, THYROIDAB in the last 72 hours. Anemia Panel: No results for input(s): VITAMINB12, FOLATE, FERRITIN, TIBC, IRON, RETICCTPCT in the last 72 hours. Sepsis Labs: Recent Labs  Lab 02/18/20 0600 02/18/20 0714 02/18/20 1002  PROCALCITON 0.20  --   --   LATICACIDVEN  --  2.8* 3.2*    No results found for this or any previous visit (from the past 240 hour(s)).       Radiology  Studies: DG Abd 1 View  Result Date: 02/18/2020 CLINICAL DATA:  Vomiting and abdominal pain. EXAM: ABDOMEN - 1 VIEW COMPARISON:  CT scan 01/11/2016 FINDINGS: Moderate diffuse gaseous distention of the colon with diffuse nodularity. This could be adherent stool or colonic polyposis. No free air is identified. No distended small bowel loops to suggest obstruction. IMPRESSION: Moderate gaseous distention of the colon with diffuse nodularity. This could be adherent stool or colonic polyposis. Electronically Signed   By: Marijo Sanes M.D.   On: 02/18/2020 08:27   DG Chest Port 1 View  Result Date: 02/18/2020 CLINICAL DATA:  Vomiting and abdominal pain. EXAM: PORTABLE CHEST 1 VIEW COMPARISON:  02/15/2020 FINDINGS: The cardiac silhouette, mediastinal and hilar contours are stable. Persistent diffuse but patchy and asymmetric pulmonary infiltrates. No pleural effusions. No pneumothorax. Air distended bowel noted in the upper abdomen. IMPRESSION: Persistent diffuse but patchy and asymmetric pulmonary infiltrates. Electronically Signed   By: Marijo Sanes M.D.   On: 02/18/2020 08:23        Scheduled Meds: . amiodarone  200 mg Oral BID  . apixaban  5 mg Oral BID  . vitamin C  500 mg Oral Daily  . aspirin EC  81 mg Oral Daily  . digoxin  0.125 mg Intravenous Daily  . diltiazem  60 mg Oral Q6H  . famotidine  20 mg Oral BID  . feeding supplement  237 mL Oral TID BM  . FLUoxetine  40 mg Oral QHS  . guaiFENesin  600 mg Oral BID  . insulin aspart  0-15 Units Subcutaneous TID WC  . insulin aspart  0-5 Units Subcutaneous QHS  . insulin aspart  3 Units Subcutaneous TID WC  . insulin glargine  11 Units Subcutaneous Daily  . Ipratropium-Albuterol  1 puff Inhalation QID  . metoCLOPramide (REGLAN) injection  10 mg Intravenous Q8H  . metoprolol tartrate  50 mg Oral BID  . mirtazapine  15 mg Oral QHS  . multivitamin with minerals  1 tablet Oral Daily  . predniSONE  40 mg Oral Q breakfast  . zinc sulfate   220 mg Oral Daily   Continuous Infusions: . sodium chloride 100 mL/hr at 02/18/20 1031     LOS: 14 days     Enzo Bi, MD [facility] Pager [phone]  If 7PM-7AM, please contact  night-coverage 02/18/2020, 6:35 PM

## 2020-02-18 NOTE — Progress Notes (Signed)
Inpatient Diabetes Program Recommendations  AACE/ADA: New Consensus Statement on Inpatient Glycemic Control (2015)  Target Ranges:  Prepandial:   less than 140 mg/dL      Peak postprandial:   less than 180 mg/dL (1-2 hours)      Critically ill patients:  140 - 180 mg/dL   Lab Results  Component Value Date   GLUCAP 123 (H) 02/18/2020   HGBA1C 6.5 (H) 02/08/2020    Review of Glycemic Control Results for [name], [name] (MRN [id number]) as of 02/18/2020 10:26  Ref. Range 02/17/2020 08:11 02/17/2020 11:58 02/17/2020 16:58 02/17/2020 17:27 02/17/2020 21:09 02/18/2020 08:27  Glucose-Capillary Latest Ref Range: 70 - 99 mg/dL 163 (H) 286 (H) 63 (L) 114 (H) 91 123 (H)    Current orders for Inpatient glycemic control:  Novolog moderate tid with meals and HS Novolog 3 units tid with meals Lantus 11 units daily Prednisone 40 mg daily  Inpatient Diabetes Program Recommendations:    Note steroids tapered.  Consider d/c of Lantus.  May still need Novolog meal coverage since patient is now taking Prednisone.   Thanks  Adah Perl, RN, BC-ADM Inpatient Diabetes Coordinator Pager [phone] (8a-5p)

## 2020-02-19 LAB — GLUCOSE, CAPILLARY
Glucose-Capillary: 92 mg/dL (ref 70–99)
Glucose-Capillary: 136 mg/dL — ABNORMAL HIGH (ref 70–99)
Glucose-Capillary: 142 mg/dL — ABNORMAL HIGH (ref 70–99)
Glucose-Capillary: 185 mg/dL — ABNORMAL HIGH (ref 70–99)

## 2020-02-19 LAB — LACTIC ACID, PLASMA: Lactic Acid, Venous: 2.4 mmol/L (ref 0.5–1.9)

## 2020-02-19 LAB — BASIC METABOLIC PANEL
Anion gap: 10 (ref 5–15)
BUN: 50 mg/dL — ABNORMAL HIGH (ref 8–23)
CO2: 24 mmol/L (ref 22–32)
Calcium: 8 mg/dL — ABNORMAL LOW (ref 8.9–10.3)
Chloride: 103 mmol/L (ref 98–111)
Creatinine, Ser: 1.28 mg/dL — ABNORMAL HIGH (ref 0.61–1.24)
GFR, Estimated: >60 mL/min (ref >60)
Glucose, Bld: 77 mg/dL (ref 70–99)
Potassium: 4.7 mmol/L (ref 3.5–5.1)
Sodium: 137 mmol/L (ref 135–145)

## 2020-02-19 LAB — C-REACTIVE PROTEIN: CRP: 22.8 mg/dL — ABNORMAL HIGH (ref <1.0)

## 2020-02-19 LAB — CBC
HCT: 40.4 % (ref 39.0–52.0)
Hemoglobin: 13.3 g/dL (ref 13.0–17.0)
MCH: 31.7 pg (ref 26.0–34.0)
MCHC: 32.9 g/dL (ref 30.0–36.0)
MCV: 96.2 fL (ref 80.0–100.0)
Platelets: 288 10*3/uL (ref 150–400)
RBC: 4.2 MIL/uL — ABNORMAL LOW (ref 4.22–5.81)
RDW: 12.7 % (ref 11.5–15.5)
WBC: 21 10*3/uL — ABNORMAL HIGH (ref 4.0–10.5)
nRBC: 0 % (ref 0.0–0.2)

## 2020-02-19 LAB — MAGNESIUM: Magnesium: 2.5 mg/dL — ABNORMAL HIGH (ref 1.7–2.4)

## 2020-02-19 MED ORDER — INSULIN GLARGINE 100 UNIT/ML ~~LOC~~ SOLN
8.0000 [IU] | Freq: Every day | SUBCUTANEOUS | Status: DC
  Administered 2020-02-20 – 2020-02-21 (×2): 8 [IU] via SUBCUTANEOUS
  Filled 2020-02-19 (×3): qty 0.08

## 2020-02-19 MED ORDER — POLYETHYLENE GLYCOL 3350 17 G PO PACK
34.0000 g | PACK | Freq: Two times a day (BID) | ORAL | Status: DC
  Administered 2020-02-19 – 2020-02-21 (×5): 34 g via ORAL
  Filled 2020-02-19 (×6): qty 2

## 2020-02-19 MED ORDER — METOCLOPRAMIDE HCL 10 MG PO TABS
10.0000 mg | ORAL_TABLET | Freq: Three times a day (TID) | ORAL | Status: DC
  Administered 2020-02-19 – 2020-02-21 (×7): 10 mg via ORAL
  Filled 2020-02-19 (×9): qty 1

## 2020-02-19 MED ORDER — SODIUM CHLORIDE 0.9 % IV SOLN: INTRAVENOUS | Status: AC

## 2020-02-19 NOTE — Progress Notes (Signed)
PROGRESS NOTE    [name]  HYQ:[id number] DOB: October 19, 1960 DOA: 02/06/2020 PCP: Baxter Hire, MD    Brief Narrative:  FernandoGarciais a59 y.o.Hispanic malewith a known history of depression and hypertension, who presented to the emergency room with acute onset of dyspnea and feveras well as dry cough and wheezing since Monday. He admits to fever and chills as well as loss of taste and smell. He has been having fatigue and tiredness. He admitted to diarrhea with bright red bleeding per rectum or melena. No nausea or vomiting.He has been exposed to hisson who wasdiagnosed with COVID-19 a couple days ago. 2 daughters also have similar symptoms. The patient was noted to have hypoxemia with pulseoximetryhas been ranging 88 to 89% on room air. The patient has not been vaccinated for COVID-19. As of 02/11/2020 patient remains in progressive cardiac unit dependent on heated high flow nasal cannula.  He is mentating clearly though he looks fatigued.  Normal work of breathing.  12/2: Patient very anxious and tearful this morning.  Repeatedly request to go home.  Explained to him in Spanish that he was markedly hypoxic and that discharge home at this time would not be safe in any way.  He expressed understanding. 12/3: Oxygen status improved over interval.  Wean from heated high flow nasal cannula to high flow nasal cannula at 15 L.  Still anxious but less.  Remains in rapid atrial fibrillation 12/4: Oxygen rate weaned to 11 L.  Patient seems very depressed and anxious still.  Has been unable to speak to his wife for unclear reasons.  I asked the nurse to put them in touch. 12/5: Oxygen rate at 10 L.  Continues to be depressed.  Long conversation with the patient's wife via phone today.  Reassured her that she continues to make daily progress as far as respiratory status goes.  Patient remains very depressed. 12/6: Oxygen remained at 10 to 11 L. Continues to express depression and  anxiety. Remains in rapid atrial fibrillation. 12/7: Oxygen remains at 8 L nasal cannula.  Respiratory status slowly improving.  In rapid atrial fibrillation.  Digoxin added by cardiology today.   Assessment & Plan:   Active Problems:   Atypical pneumonia  Multifocal pneumonia secondary to COVID-19 Severe acute hypoxic respiratory failure secondary to above Oxygen saturation on admission 86% Oxygen requirement has gone up substantially since then Completed course of remdesivir in house Weaned off heated high flow nasal cannula 12/7: Weaned to 8 L high flow Plan: --cont solumedrol 40 mg q8h due to CRP again trending up --Monitor and ensure CRP down-trending Continue baricitinib Stress I-S and flutter use Out of bed to chair as tolerated Combivent 4 times daily Wean oxygen as tolerated  Acute anxiety/depression 02/12/20 patient very tearful and anxious Wishes to go home Requests medication for antianxiety Wife is concerned that medication could be worsening or causing delirium, Xanax d/c'ed Plan: --cont atarax PRN --Continue home Prozac --cont Remeron  Atrial fibrillation with rapid ventricular response No documented history of arrhythmia Development possibly due to Covid infection and hypoxia Cardiology following, not recommending cardioversion --rate finally appeared controlled and Afib converted today. Plan: Amiodarone 400 mg p.o. twice daily Continue Cardizem 60 mg every 6 hours --cont Lopressor 50 mg BID --IV digoxin daily, per cardiology --cont Eliquis  Hypokalemia --monitor and replete PRN  Hyponatremia, resolved Monitor  Essential hypertension Not taking any home medications --cont dilt and lopressor  GERD Pepcid  Hyperglycemia DM2, new dx --A1c 6.5 --cont Lantus 11u daily --  cont mealtime 3u TID Continue Accu-Cheks and sliding scale insulin   DVT prophylaxis: Lovenox Code Status: Full Family Communication:  Disposition Plan: Status is:  Inpatient  Remains inpatient appropriate because:Inpatient level of care appropriate due to severity of illness   Dispo: The patient is from: Home              Anticipated d/c is to: Home              Anticipated d/c date is: >3 days              Patient currently is not medically stable to d/c.   Remains hypoxic requiring >12L  high flow nasal cannula.     Consultants:   Cardiology-[facility]  Procedures:   None  Antimicrobials:   None   Subjective: iPad translator used.  Still reported nausea, but no overt vomiting.  Poor appetite.  No BM still.  Pt reported drinking maybe 1/2 of his Ensure.     Objective: Vitals:   02/19/20 0548 02/19/20 0600 02/19/20 0800 02/19/20 1443  BP:  107/67 105/76   Pulse:  69 76 (!) 58  Resp:  (!) 26 19 18   Temp:   98 F (36.7 C)   TempSrc:      SpO2: (!) 88% 91% 92% (!) 87%  Weight: 85 kg     Height:        Intake/Output Summary (Last 24 hours) at 02/19/2020 1531 Last data filed at 02/19/2020 0848 Gross per 24 hour  Intake 240 ml  Output 900 ml  Net -660 ml   Filed Weights   02/17/20 0354 02/18/20 0327 02/19/20 0548  Weight: 85.3 kg 84.7 kg 85 kg    Examination:  Constitutional: NAD, AAOx3 HEENT: conjunctivae and lids normal, EOMI CV: No cyanosis.   RESP: no distress, on hfNC GI: abdomen soft, NTND Extremities: No effusions, edema in BLE SKIN: warm, dry and intact Neuro: II - XII grossly intact.   Psych: depressed mood and affect.     Data Reviewed: I have personally reviewed following labs and imaging studies  CBC: Recent Labs  Lab 02/13/20 0428 02/14/20 0438 02/17/20 0346 02/18/20 0436 02/19/20 0430  WBC 18.8* 27.0* 30.5* 26.6* 21.0*  NEUTROABS 16.8* 24.9*  --  25.2*  --   HGB 15.1 16.0 15.3 14.5 13.3  HCT 46.5 49.0 46.0 44.5 40.4  MCV 99.1 97.6 97.5 96.3 96.2  PLT 653* 646* 488* 407* 976   Basic Metabolic Panel: Recent Labs  Lab 02/13/20 0428 02/14/20 0438 02/15/20 0718 02/18/20 0436  02/18/20 0600 02/19/20 0430  NA 139 140 135 136  --  137  K 4.4 4.5 4.7 4.5  --  4.7  CL 96* 96* 97* 99  --  103  CO2 32 30 28 27   --  24  GLUCOSE 159* 176* 135* 91  --  77  BUN 40* 43* 45* 49*  --  50*  CREATININE 1.18 1.19 1.13 1.30*  --  1.28*  CALCIUM 8.6* 8.5* 8.4* 8.4*  --  8.0*  MG  --   --   --   --  2.7* 2.5*   GFR: Estimated Creatinine Clearance: 61.6 mL/min (A) (by C-G formula based on SCr of 1.28 mg/dL (H)). Liver Function Tests: No results for input(s): AST, ALT, ALKPHOS, BILITOT, PROT, ALBUMIN in the last 168 hours. No results for input(s): LIPASE, AMYLASE in the last 168 hours. No results for input(s): AMMONIA in the last 168 hours. Coagulation Profile: No  results for input(s): INR, PROTIME in the last 168 hours. Cardiac Enzymes: No results for input(s): CKTOTAL, CKMB, CKMBINDEX, TROPONINI in the last 168 hours. BNP (last 3 results) No results for input(s): PROBNP in the last 8760 hours. HbA1C: No results for input(s): HGBA1C in the last 72 hours. CBG: Recent Labs  Lab 02/18/20 1201 02/18/20 1650 02/18/20 2051 02/19/20 0849 02/19/20 1159  GLUCAP 185* 106* 93 92 136*   Lipid Profile: No results for input(s): CHOL, HDL, LDLCALC, TRIG, CHOLHDL, LDLDIRECT in the last 72 hours. Thyroid Function Tests: No results for input(s): TSH, T4TOTAL, FREET4, T3FREE, THYROIDAB in the last 72 hours. Anemia Panel: No results for input(s): VITAMINB12, FOLATE, FERRITIN, TIBC, IRON, RETICCTPCT in the last 72 hours. Sepsis Labs: Recent Labs  Lab 02/18/20 0600 02/18/20 0714 02/18/20 1002 02/19/20 1124  PROCALCITON 0.20  --   --   --   LATICACIDVEN  --  2.8* 3.2* 2.4*    No results found for this or any previous visit (from the past 240 hour(s)).       Radiology Studies: DG Abd 1 View  Result Date: 02/18/2020 CLINICAL DATA:  Vomiting and abdominal pain. EXAM: ABDOMEN - 1 VIEW COMPARISON:  CT scan 01/11/2016 FINDINGS: Moderate diffuse gaseous distention of the  colon with diffuse nodularity. This could be adherent stool or colonic polyposis. No free air is identified. No distended small bowel loops to suggest obstruction. IMPRESSION: Moderate gaseous distention of the colon with diffuse nodularity. This could be adherent stool or colonic polyposis. Electronically Signed   By: Marijo Sanes M.D.   On: 02/18/2020 08:27   DG Chest Port 1 View  Result Date: 02/18/2020 CLINICAL DATA:  Vomiting and abdominal pain. EXAM: PORTABLE CHEST 1 VIEW COMPARISON:  02/15/2020 FINDINGS: The cardiac silhouette, mediastinal and hilar contours are stable. Persistent diffuse but patchy and asymmetric pulmonary infiltrates. No pleural effusions. No pneumothorax. Air distended bowel noted in the upper abdomen. IMPRESSION: Persistent diffuse but patchy and asymmetric pulmonary infiltrates. Electronically Signed   By: Marijo Sanes M.D.   On: 02/18/2020 08:23        Scheduled Meds: . amiodarone  200 mg Oral BID  . apixaban  5 mg Oral BID  . vitamin C  500 mg Oral Daily  . aspirin EC  81 mg Oral Daily  . digoxin  0.125 mg Intravenous Daily  . diltiazem  60 mg Oral Q6H  . famotidine  20 mg Oral BID  . feeding supplement  237 mL Oral TID BM  . FLUoxetine  40 mg Oral QHS  . guaiFENesin  600 mg Oral BID  . insulin aspart  0-15 Units Subcutaneous TID WC  . insulin aspart  3 Units Subcutaneous TID WC  . [START ON 02/20/2020] insulin glargine  8 Units Subcutaneous Daily  . Ipratropium-Albuterol  1 puff Inhalation QID  . methylPREDNISolone (SOLU-MEDROL) injection  40 mg Intravenous Q8H  . metoCLOPramide  10 mg Oral TID AC  . metoprolol tartrate  50 mg Oral BID  . mirtazapine  15 mg Oral QHS  . multivitamin with minerals  1 tablet Oral Daily  . polyethylene glycol  34 g Oral BID  . zinc sulfate  220 mg Oral Daily   Continuous Infusions: . sodium chloride       LOS: 15 days     Enzo Bi, MD [facility] Pager [phone]  If 7PM-7AM, please contact  night-coverage 02/19/2020, 3:31 PM

## 2020-02-19 NOTE — Plan of Care (Signed)
  Problem: Education: °Goal: Knowledge of General Education information will improve °Description: Including pain rating scale, medication(s)/side effects and non-pharmacologic comfort measures °Outcome: Progressing °  °Problem: Health Behavior/Discharge Planning: °Goal: Ability to manage health-related needs will improve °Outcome: Progressing °  °Problem: Clinical Measurements: °Goal: Ability to maintain clinical measurements within normal limits will improve °Outcome: Progressing °Goal: Will remain free from infection °Outcome: Progressing °Goal: Diagnostic test results will improve °Outcome: Progressing °Goal: Respiratory complications will improve °Outcome: Progressing °Goal: Cardiovascular complication will be avoided °Outcome: Progressing °  °Problem: Activity: °Goal: Risk for activity intolerance will decrease °Outcome: Progressing °  °Problem: Nutrition: °Goal: Adequate nutrition will be maintained °Outcome: Progressing °  °Problem: Coping: °Goal: Level of anxiety will decrease °Outcome: Progressing °  °Problem: Elimination: °Goal: Will not experience complications related to bowel motility °Outcome: Progressing °Goal: Will not experience complications related to urinary retention °Outcome: Progressing °  °Problem: Pain Managment: °Goal: General experience of comfort will improve °Outcome: Progressing °  °Problem: Safety: °Goal: Ability to remain free from injury will improve °Outcome: Progressing °  °Problem: Skin Integrity: °Goal: Risk for impaired skin integrity will decrease °Outcome: Progressing °  °Problem: Education: °Goal: Knowledge of risk factors and measures for prevention of condition will improve °Outcome: Progressing °  °Problem: Coping: °Goal: Psychosocial and spiritual needs will be supported °Outcome: Progressing °  °Problem: Respiratory: °Goal: Will maintain a patent airway °Outcome: Progressing °Goal: Complications related to the disease process, condition or treatment will be avoided or  minimized °Outcome: Progressing °  °Problem: Education: °Goal: Knowledge of risk factors and measures for prevention of condition will improve °Outcome: Progressing °  °Problem: Coping: °Goal: Psychosocial and spiritual needs will be supported °Outcome: Progressing °  °Problem: Respiratory: °Goal: Will maintain a patent airway °Outcome: Progressing °Goal: Complications related to the disease process, condition or treatment will be avoided or minimized °Outcome: Progressing °  °

## 2020-02-20 ENCOUNTER — Inpatient Hospital Stay: Payer: Medicare Other

## 2020-02-20 LAB — CBC
HCT: 38.8 % — ABNORMAL LOW (ref 39.0–52.0)
Hemoglobin: 12.7 g/dL — ABNORMAL LOW (ref 13.0–17.0)
MCH: 32.1 pg (ref 26.0–34.0)
MCHC: 32.7 g/dL (ref 30.0–36.0)
MCV: 98 fL (ref 80.0–100.0)
Platelets: 199 10*3/uL (ref 150–400)
RBC: 3.96 MIL/uL — ABNORMAL LOW (ref 4.22–5.81)
RDW: 12.8 % (ref 11.5–15.5)
WBC: 18.4 10*3/uL — ABNORMAL HIGH (ref 4.0–10.5)
nRBC: 0 % (ref 0.0–0.2)

## 2020-02-20 LAB — BASIC METABOLIC PANEL
Anion gap: 13 (ref 5–15)
BUN: 51 mg/dL — ABNORMAL HIGH (ref 8–23)
CO2: 23 mmol/L (ref 22–32)
Calcium: 8.3 mg/dL — ABNORMAL LOW (ref 8.9–10.3)
Chloride: 99 mmol/L (ref 98–111)
Creatinine, Ser: 1.38 mg/dL — ABNORMAL HIGH (ref 0.61–1.24)
GFR, Estimated: 58 mL/min — ABNORMAL LOW (ref >60)
Glucose, Bld: 176 mg/dL — ABNORMAL HIGH (ref 70–99)
Potassium: 4.9 mmol/L (ref 3.5–5.1)
Sodium: 135 mmol/L (ref 135–145)

## 2020-02-20 LAB — GLUCOSE, CAPILLARY
Glucose-Capillary: 178 mg/dL — ABNORMAL HIGH (ref 70–99)
Glucose-Capillary: 163 mg/dL — ABNORMAL HIGH (ref 70–99)
Glucose-Capillary: 231 mg/dL — ABNORMAL HIGH (ref 70–99)
Glucose-Capillary: 238 mg/dL — ABNORMAL HIGH (ref 70–99)

## 2020-02-20 LAB — C-REACTIVE PROTEIN: CRP: 31.3 mg/dL — ABNORMAL HIGH (ref <1.0)

## 2020-02-20 LAB — LACTIC ACID, PLASMA: Lactic Acid, Venous: 2.6 mmol/L (ref 0.5–1.9)

## 2020-02-20 LAB — FIBRIN DERIVATIVES D-DIMER (ARMC ONLY): Fibrin derivatives D-dimer (ARMC): 1222.73 ng/mL (FEU) — ABNORMAL HIGH (ref 0.00–499.00)

## 2020-02-20 LAB — MAGNESIUM: Magnesium: 2.6 mg/dL — ABNORMAL HIGH (ref 1.7–2.4)

## 2020-02-20 MED ORDER — METHYLPREDNISOLONE SODIUM SUCC 125 MG IJ SOLR
60.0000 mg | Freq: Three times a day (TID) | INTRAMUSCULAR | Status: DC
  Administered 2020-02-20 – 2020-02-21 (×3): 60 mg via INTRAVENOUS
  Filled 2020-02-20 (×3): qty 2

## 2020-02-20 NOTE — Progress Notes (Signed)
Occupational Therapy Treatment Patient Details Name: Alfred Clements MRN: [id number] DOB: Jun 11, 1960 Today's Date: 02/20/2020    History of present illness Alfred Clements  is a 59 y.o. Hispanic male with a known history of depression and hypertension, who presented to the emergency room with acute onset of dyspnea and fever as well as dry cough and wheezing. COVID positive with spesis related to COVID, COVID-19 pneumonia, generalized weakness and fatigue, rapid atrial fibrilation.    OT comments  Upon entering the room, pt supine in bed and agreeable to OT intervention. OT utilized [employer] interpreter as pt is spanish speaking. OT verbalized importance of mobility when having diagnosis of Covid. Pt initially pointing to stomach and not wanting to work with therapist but agreeable with encouragement and education regarding importance. OT repositioned chair and bed in room. Pt on 35 L O2 this session and saturation of 92% at rest. Pt performed bed mobility with min guard and sitting on EOB to recover. Pt very anxious about mobility and standing with min A and taking several steps to the R to transfer into recliner chair. O2 saturation at 77% with activity. Pt needing several minutes to recover but able to do so without additional support. OT instructed pt in glute squeezes, ankle pumps, and UE AROM while seated in recliner chair. Pt goal to sit up for 1-2  Hours. RN notified. Pt verbalized no further concerns and call bell within reach upon exiting the room.   Follow Up Recommendations  Home health OT;Supervision/Assistance - 24 hour    Equipment Recommendations  3 in 1 bedside commode       Precautions / Restrictions Precautions Precautions: Fall Restrictions Weight Bearing Restrictions: No       Mobility Bed Mobility Overal bed mobility: Needs Assistance Bed Mobility: Supine to Sit Rolling: Supervision   Supine to sit: Min guard     General bed mobility comments: min guard and  cuing for hand placement  Transfers Overall transfer level: Needs assistance Equipment used: 1 person hand held assist Transfers: Sit to/from Stand;Stand Pivot Transfers Sit to Stand: Min assist Stand pivot transfers: Min assist       General transfer comment: desaturation with transfer to 77% on HFNC 35L    Balance Overall balance assessment: Needs assistance Sitting-balance support: Feet supported;Bilateral upper extremity supported Sitting balance-Leahy Scale: Good     Standing balance support: No upper extremity supported Standing balance-Leahy Scale: Poor                             ADL either performed or assessed with clinical judgement   ADL Overall ADL's : Needs assistance/impaired                     Lower Body Dressing: Minimal assistance Lower Body Dressing Details (indicate cue type and reason): Min A to get B heels into shoes while seated on EOB                     Vision Patient Visual Report: No change from baseline            Cognition Arousal/Alertness: Awake/alert Behavior During Therapy: Flat affect Overall Cognitive Status: Within Functional Limits for tasks assessed  Pertinent Vitals/ Pain       Pain Assessment: Faces Faces Pain Scale: Hurts little more Pain Location: nausea -points to stomach Pain Descriptors / Indicators: Other (Comment) (nausea) Pain Intervention(s): Limited activity within patient's tolerance;Monitored during session;Premedicated before session;Repositioned         Frequency  Min 2X/week        Progress Toward Goals  OT Goals(current goals can now be found in the care plan section)  Progress towards OT goals: Progressing toward goals  Acute Rehab OT Goals Patient Stated Goal: to go home  OT Goal Formulation: With patient Time For Goal Achievement: 02/29/20 Potential to Achieve Goals: Good  Plan Discharge  plan remains appropriate       AM-PAC OT "6 Clicks" Daily Activity     Outcome Measure   Help from another person eating meals?: None Help from another person taking care of personal grooming?: A Little Help from another person toileting, which includes using toliet, bedpan, or urinal?: A Little Help from another person bathing (including washing, rinsing, drying)?: A Little Help from another person to put on and taking off regular upper body clothing?: A Little Help from another person to put on and taking off regular lower body clothing?: A Little 6 Click Score: 19    End of Session Equipment Utilized During Treatment: Oxygen (35L)  OT Visit Diagnosis: Other abnormalities of gait and mobility (R26.89)   Activity Tolerance Patient tolerated treatment well;Patient limited by fatigue   Patient Left with call bell/phone within reach;in chair;with chair alarm set   Nurse Communication Mobility status        Time: 7615-1834 OT Time Calculation (min): 38 min  Charges: OT General Charges $OT Visit: 1 Visit OT Treatments $Therapeutic Activity: 38-52 mins  Darleen Crocker, MS, OTR/L , CBIS ascom [phone]  02/20/20, 4:25 PM

## 2020-02-20 NOTE — Progress Notes (Signed)
Pt on 15L HFNC this am.  O2 saturations low to mid 80's.  RT consulted, Heated HFNC applied.  Pt tolerating well, O2 saturations improved to mid 90's.  PT refused breakfast.  RN will continue to monitor.  Justice Britain, RN 02/20/2020 10:29 AM

## 2020-02-20 NOTE — Progress Notes (Addendum)
PROGRESS NOTE    Alfred Clements  YTK:[id number] DOB: 03-Jan-1961 DOA: 01/19/2020 PCP: Alfred Hire, MD    Brief Narrative:  FernandoGarciais a59 y.o.Hispanic malewith a known history of depression and hypertension, who presented to the emergency room with acute onset of dyspnea and feveras well as dry cough and wheezing since Monday. He admits to fever and chills as well as loss of taste and smell. He has been having fatigue and tiredness. He admitted to diarrhea with bright red bleeding per rectum or melena. No nausea or vomiting.He has been exposed to hisson who wasdiagnosed with COVID-19 a couple days ago. 2 daughters also have similar symptoms. The patient was noted to have hypoxemia with pulseoximetryhas been ranging 88 to 89% on room air. The patient has not been vaccinated for COVID-19. As of 02/11/2020 patient remains in progressive cardiac unit dependent on heated high flow nasal cannula.  He is mentating clearly though he looks fatigued.  Normal work of breathing.  12/2: Patient very anxious and tearful this morning.  Repeatedly request to go home.  Explained to him in Spanish that he was markedly hypoxic and that discharge home at this time would not be safe in any way.  He expressed understanding. 12/3: Oxygen status improved over interval.  Wean from heated high flow nasal cannula to high flow nasal cannula at 15 L.  Still anxious but less.  Remains in rapid atrial fibrillation 12/4: Oxygen rate weaned to 11 L.  Patient seems very depressed and anxious still.  Has been unable to speak to his wife for unclear reasons.  I asked the nurse to put them in touch. 12/5: Oxygen rate at 10 L.  Continues to be depressed.  Long conversation with the patient's wife via phone today.  Reassured her that she continues to make daily progress as far as respiratory status goes.  Patient remains very depressed. 12/6: Oxygen remained at 10 to 11 L. Continues to express depression and  anxiety. Remains in rapid atrial fibrillation. 12/7: Oxygen remains at 8 L nasal cannula.  Respiratory status slowly improving.  In rapid atrial fibrillation.  Digoxin added by cardiology today.   Assessment & Plan:   Active Problems:   Atypical pneumonia  Multifocal pneumonia secondary to COVID-19 Severe acute hypoxic respiratory failure secondary to above Oxygen saturation on admission 86% Oxygen requirement has gone up substantially since then Completed course of remdesivir in house Weaned off heated high flow nasal cannula 12/7: Weaned to 8 L high flow --12/11, back on heated hf --CRP trending up to 31 despite being on solumedrol 40 mg q8 Plan: --increase solumedrol to 60 mg q8h  --Monitor and ensure CRP down-trending Continue baricitinib Stress I-S and flutter use Out of bed to chair as tolerated Combivent 4 times daily --Continue supplemental O2 to keep sats >=88%, wean as tolerated  Acute anxiety/depression 02/12/20 patient very tearful and anxious Wishes to go home Requests medication for antianxiety Wife is concerned that medication could be worsening or causing delirium, Xanax d/c'ed Plan: --cont atarax PRN --Continue home Prozac --cont Remeron  Atrial fibrillation with rapid ventricular response No documented history of arrhythmia Development possibly due to Covid infection and hypoxia Cardiology following, not recommending cardioversion --rate finally appeared controlled and Afib converted on 12/9. Plan: --cont oral amiodarone 200 mg BID --Continue Cardizem 60 mg every 6 hours --cont Lopressor 50 mg BID --IV digoxin daily, per cardiology --cont Eliquis  Hypokalemia --monitor and replete PRN  Hyponatremia, resolved Monitor  Essential hypertension Not on home  BP medications --cont dilt and metop  GERD Pepcid  Hyperglycemia DM2, new dx --A1c 6.5 --reduce Lantus to 8u daily --cont mealtime 3u TID --SSI TID  Poor oral intake --cont Remeron  and Reglan  --Ensure TID --allow family to bring food if pt wants  Lactic acidosis --lactic acid has been between 2-3 since 12/8, despite getting MIVF@100  for 10 hours for 2 days.  Procal 0.2 on 12/8, unchanged from 2 weeks ago.  CXR today unchanged from 12/8.  No signs of additional bacterial infection (PNA) at this point. --will hold further IVF since pt's O2 requirement has been trending up.   DVT prophylaxis: Lovenox Code Status: Full Family Communication:  Disposition Plan: Status is: Inpatient  Remains inpatient appropriate because:Inpatient level of care appropriate due to severity of illness   Dispo: The patient is from: Home              Anticipated d/c is to: Home              Anticipated d/c date is: >3 days              Patient currently is not medically stable to d/c.  Worsening hypoxia now back on heated hf.  Worsening CRP despite being on solumedrol q8h    Consultants:   Cardiology-[facility]  Procedures:   None  Antimicrobials:   None   Subjective: Back on heated hf again.  CRP trending up despite being on solumedrol q8h.  Pt still has very poor oral intake.  No N/V/D.  No BM yet.   Objective: Vitals:   02/20/20 1600 02/20/20 2000 02/21/20 0015 02/21/20 0030  BP: 105/75 102/68  111/83  Pulse: 77 79    Resp:  (!) 22    Temp:  98 F (36.7 C)    TempSrc:  Oral    SpO2: 92% 96% 95%   Weight:      Height:        Intake/Output Summary (Last 24 hours) at 02/21/2020 0208 Last data filed at 02/20/2020 2143 Gross per 24 hour  Intake 240 ml  Output 475 ml  Net -235 ml   Filed Weights   02/18/20 0327 02/19/20 0548 02/20/20 0318  Weight: 84.7 kg 85 kg 85.4 kg    Examination:  Constitutional: NAD, AAOx3 HEENT: conjunctivae and lids normal, EOMI CV: No cyanosis.   RESP: crackles at bases, on heated hf Extremities: No effusions, edema in BLE SKIN: warm, dry and intact Neuro: II - XII grossly intact.   Psych: depressed mood and affect.      Data Reviewed: I have personally reviewed following labs and imaging studies  CBC: Recent Labs  Lab 02/14/20 0438 02/17/20 0346 02/18/20 0436 02/19/20 0430 02/20/20 0730  WBC 27.0* 30.5* 26.6* 21.0* 18.4*  NEUTROABS 24.9*  --  25.2*  --   --   HGB 16.0 15.3 14.5 13.3 12.7*  HCT 49.0 46.0 44.5 40.4 38.8*  MCV 97.6 97.5 96.3 96.2 98.0  PLT 646* 488* 407* 288 397   Basic Metabolic Panel: Recent Labs  Lab 02/14/20 0438 02/15/20 0718 02/18/20 0436 02/18/20 0600 02/19/20 0430 02/20/20 0730  NA 140 135 136  --  137 135  K 4.5 4.7 4.5  --  4.7 4.9  CL 96* 97* 99  --  103 99  CO2 30 28 27   --  24 23  GLUCOSE 176* 135* 91  --  77 176*  BUN 43* 45* 49*  --  50* 51*  CREATININE  1.19 1.13 1.30*  --  1.28* 1.38*  CALCIUM 8.5* 8.4* 8.4*  --  8.0* 8.3*  MG  --   --   --  2.7* 2.5* 2.6*   GFR: Estimated Creatinine Clearance: 57.2 mL/min (A) (by C-G formula based on SCr of 1.38 mg/dL (H)). Liver Function Tests: No results for input(s): AST, ALT, ALKPHOS, BILITOT, PROT, ALBUMIN in the last 168 hours. No results for input(s): LIPASE, AMYLASE in the last 168 hours. No results for input(s): AMMONIA in the last 168 hours. Coagulation Profile: No results for input(s): INR, PROTIME in the last 168 hours. Cardiac Enzymes: No results for input(s): CKTOTAL, CKMB, CKMBINDEX, TROPONINI in the last 168 hours. BNP (last 3 results) No results for input(s): PROBNP in the last 8760 hours. HbA1C: No results for input(s): HGBA1C in the last 72 hours. CBG: Recent Labs  Lab 02/19/20 2022 02/20/20 0844 02/20/20 1251 02/20/20 1801 02/20/20 2048  GLUCAP 185* 178* 163* 231* 238*   Lipid Profile: No results for input(s): CHOL, HDL, LDLCALC, TRIG, CHOLHDL, LDLDIRECT in the last 72 hours. Thyroid Function Tests: No results for input(s): TSH, T4TOTAL, FREET4, T3FREE, THYROIDAB in the last 72 hours. Anemia Panel: No results for input(s): VITAMINB12, FOLATE, FERRITIN, TIBC, IRON, RETICCTPCT in  the last 72 hours. Sepsis Labs: Recent Labs  Lab 02/18/20 0600 02/18/20 0714 02/18/20 1002 02/19/20 1124 02/20/20 1126  PROCALCITON 0.20  --   --   --   --   LATICACIDVEN  --  2.8* 3.2* 2.4* 2.6*    No results found for this or any previous visit (from the past 240 hour(s)).       Radiology Studies: DG Chest Port 1 View  Result Date: 02/20/2020 CLINICAL DATA:  Worsening hypoxia COVID positive EXAM: PORTABLE CHEST 1 VIEW COMPARISON:  02/18/2020, 02/15/2020, 02/03/2020 FINDINGS: Low lung volumes. Fairly extensive bilateral lung consolidations and ground-glass opacities, not much interval change since 02/18/2020 but progressed compared to 02/15/2020. Stable cardiomediastinal silhouette. No pneumothorax. IMPRESSION: Fairly extensive bilateral lung consolidations and ground-glass opacities, not much changed since 02/18/2020 but progressed compared to 02/15/2020. Electronically Signed   By: Donavan Foil M.D.   On: 02/20/2020 16:09        Scheduled Meds: . amiodarone  200 mg Oral BID  . apixaban  5 mg Oral BID  . vitamin C  500 mg Oral Daily  . aspirin EC  81 mg Oral Daily  . digoxin  0.125 mg Intravenous Daily  . diltiazem  60 mg Oral Q6H  . famotidine  20 mg Oral BID  . feeding supplement  237 mL Oral TID BM  . FLUoxetine  40 mg Oral QHS  . guaiFENesin  600 mg Oral BID  . insulin aspart  0-15 Units Subcutaneous TID WC  . insulin aspart  3 Units Subcutaneous TID WC  . insulin glargine  8 Units Subcutaneous Daily  . Ipratropium-Albuterol  1 puff Inhalation QID  . methylPREDNISolone (SOLU-MEDROL) injection  60 mg Intravenous Q8H  . metoCLOPramide  10 mg Oral TID AC  . metoprolol tartrate  50 mg Oral BID  . mirtazapine  15 mg Oral QHS  . multivitamin with minerals  1 tablet Oral Daily  . polyethylene glycol  34 g Oral BID  . zinc sulfate  220 mg Oral Daily   Continuous Infusions:    LOS: 17 days     Enzo Bi, MD [facility] Pager [phone]  If  7PM-7AM, please contact night-coverage 02/21/2020, 2:08 AM

## 2020-02-21 ENCOUNTER — Inpatient Hospital Stay: Payer: Self-pay

## 2020-02-21 ENCOUNTER — Inpatient Hospital Stay: Payer: Medicare Other

## 2020-02-21 DIAGNOSIS — J9600 Acute respiratory failure, unspecified whether with hypoxia or hypercapnia: Secondary | ICD-10-CM

## 2020-02-21 DIAGNOSIS — U071 COVID-19: Secondary | ICD-10-CM

## 2020-02-21 DIAGNOSIS — A419 Sepsis, unspecified organism: Secondary | ICD-10-CM

## 2020-02-21 DIAGNOSIS — N179 Acute kidney failure, unspecified: Secondary | ICD-10-CM

## 2020-02-21 DIAGNOSIS — R6521 Severe sepsis with septic shock: Secondary | ICD-10-CM

## 2020-02-21 LAB — BLOOD GAS, ARTERIAL
Acid-base deficit: 1.8 mmol/L (ref 0.0–2.0)
Acid-base deficit: 13.8 mmol/L — ABNORMAL HIGH (ref 0.0–2.0)
Allens test (pass/fail): POSITIVE — AB
Bicarbonate: 15.6 mmol/L — ABNORMAL LOW (ref 20.0–28.0)
Bicarbonate: 25.4 mmol/L (ref 20.0–28.0)
FIO2: 100
FIO2: 100
MECHVT: 500 mL
MECHVT: 600 mL
Mechanical Rate: 28
O2 Saturation: 87.2 %
O2 Saturation: 88 %
PEEP: 10 cmH2O
PEEP: 15 cmH2O
Patient temperature: 37
Patient temperature: 37
RATE: 22 resp/min
pCO2 arterial: 49 mmHg — ABNORMAL HIGH (ref 32.0–48.0)
pCO2 arterial: 54 mmHg — ABNORMAL HIGH (ref 32.0–48.0)
pH, Arterial: 7.11 — CL (ref 7.350–7.450)
pH, Arterial: 7.28 — ABNORMAL LOW (ref 7.350–7.450)
pO2, Arterial: 62 mmHg — ABNORMAL LOW (ref 83.0–108.0)
pO2, Arterial: 72 mmHg — ABNORMAL LOW (ref 83.0–108.0)

## 2020-02-21 LAB — BLOOD GAS, VENOUS
Acid-base deficit: 15.4 mmol/L — ABNORMAL HIGH (ref 0.0–2.0)
Bicarbonate: 11.3 mmol/L — ABNORMAL LOW (ref 20.0–28.0)
FIO2: 100
MECHVT: 600 mL
Mechanical Rate: 28
O2 Saturation: 65.7 %
PEEP: 10 cmH2O
Patient temperature: 37
pCO2, Ven: 29 mmHg — ABNORMAL LOW (ref 44.0–60.0)
pH, Ven: 7.2 — ABNORMAL LOW (ref 7.250–7.430)
pO2, Ven: 43 mmHg (ref 32.0–45.0)

## 2020-02-21 LAB — CBC
HCT: 36.2 % — ABNORMAL LOW (ref 39.0–52.0)
Hemoglobin: 12 g/dL — ABNORMAL LOW (ref 13.0–17.0)
MCH: 31.8 pg (ref 26.0–34.0)
MCHC: 33.1 g/dL (ref 30.0–36.0)
MCV: 96 fL (ref 80.0–100.0)
Platelets: 206 10*3/uL (ref 150–400)
RBC: 3.77 MIL/uL — ABNORMAL LOW (ref 4.22–5.81)
RDW: 12.9 % (ref 11.5–15.5)
WBC: 17.3 10*3/uL — ABNORMAL HIGH (ref 4.0–10.5)
nRBC: 0 % (ref 0.0–0.2)

## 2020-02-21 LAB — GLUCOSE, CAPILLARY
Glucose-Capillary: 268 mg/dL — ABNORMAL HIGH (ref 70–99)
Glucose-Capillary: 312 mg/dL — ABNORMAL HIGH (ref 70–99)
Glucose-Capillary: 268 mg/dL — ABNORMAL HIGH (ref 70–99)
Glucose-Capillary: 217 mg/dL — ABNORMAL HIGH (ref 70–99)
Glucose-Capillary: 141 mg/dL — ABNORMAL HIGH (ref 70–99)

## 2020-02-21 LAB — BASIC METABOLIC PANEL
Anion gap: 15 (ref 5–15)
BUN: 76 mg/dL — ABNORMAL HIGH (ref 8–23)
CO2: 22 mmol/L (ref 22–32)
Calcium: 8.2 mg/dL — ABNORMAL LOW (ref 8.9–10.3)
Chloride: 99 mmol/L (ref 98–111)
Creatinine, Ser: 2.25 mg/dL — ABNORMAL HIGH (ref 0.61–1.24)
GFR, Estimated: 32 mL/min — ABNORMAL LOW (ref >60)
Glucose, Bld: 249 mg/dL — ABNORMAL HIGH (ref 70–99)
Potassium: 5.1 mmol/L (ref 3.5–5.1)
Sodium: 136 mmol/L (ref 135–145)

## 2020-02-21 LAB — LACTIC ACID, PLASMA
Lactic Acid, Venous: >11 mmol/L (ref 0.5–1.9)
Lactic Acid, Venous: 7.9 mmol/L (ref 0.5–1.9)
Lactic Acid, Venous: >11 mmol/L (ref 0.5–1.9)

## 2020-02-21 LAB — C-REACTIVE PROTEIN: CRP: 31.6 mg/dL — ABNORMAL HIGH (ref <1.0)

## 2020-02-21 LAB — TROPONIN I (HIGH SENSITIVITY): Troponin I (High Sensitivity): 517 ng/L (ref <18)

## 2020-02-21 LAB — MRSA PCR SCREENING: MRSA by PCR: NEGATIVE

## 2020-02-21 LAB — POTASSIUM: Potassium: 3.7 mmol/L (ref 3.5–5.1)

## 2020-02-21 LAB — MAGNESIUM: Magnesium: 2.7 mg/dL — ABNORMAL HIGH (ref 1.7–2.4)

## 2020-02-21 LAB — PROCALCITONIN: Procalcitonin: 3.64 ng/mL

## 2020-02-21 MED ORDER — SODIUM CHLORIDE 0.9% FLUSH: 10.0000 mL | INTRAVENOUS | Status: DC | PRN

## 2020-02-21 MED ORDER — LACTATED RINGERS IV BOLUS
1000.0000 mL | Freq: Once | INTRAVENOUS | Status: AC
  Administered 2020-02-21: 16:00:00 1000 mL via INTRAVENOUS

## 2020-02-21 MED ORDER — SODIUM BICARBONATE 8.4 % IV SOLN
INTRAVENOUS | Status: AC
  Administered 2020-02-21: 22:00:00 150 meq
  Filled 2020-02-21: qty 150

## 2020-02-21 MED ORDER — PANTOPRAZOLE SODIUM 40 MG IV SOLR
40.0000 mg | INTRAVENOUS | Status: DC
  Administered 2020-02-21: 22:00:00 40 mg via INTRAVENOUS
  Filled 2020-02-21: qty 40

## 2020-02-21 MED ORDER — FENTANYL CITRATE (PF) 100 MCG/2ML IJ SOLN
INTRAMUSCULAR | Status: AC
  Filled 2020-02-21: qty 2

## 2020-02-21 MED ORDER — NOREPINEPHRINE 4 MG/250ML-% IV SOLN
INTRAVENOUS | Status: AC
  Filled 2020-02-21: qty 250

## 2020-02-21 MED ORDER — CHLORHEXIDINE GLUCONATE CLOTH 2 % EX PADS: 6.0000 | MEDICATED_PAD | Freq: Every day | CUTANEOUS | Status: DC

## 2020-02-21 MED ORDER — EPINEPHRINE PF 1 MG/ML IJ SOLN
1.0000 mg | Freq: Once | INTRAMUSCULAR | Status: AC
  Administered 2020-02-22: 01:00:00 1 mg via INTRAVENOUS

## 2020-02-21 MED ORDER — NOREPINEPHRINE 4 MG/250ML-% IV SOLN
2.0000 ug/min | INTRAVENOUS | Status: DC
  Administered 2020-02-21: 16:00:00 5 ug/min via INTRAVENOUS
  Administered 2020-02-21: 19:00:00 12 ug/min via INTRAVENOUS
  Filled 2020-02-21: qty 250

## 2020-02-21 MED ORDER — SODIUM CHLORIDE 0.9 % IV SOLN
2.0000 g | Freq: Two times a day (BID) | INTRAVENOUS | Status: DC
  Administered 2020-02-22: 05:00:00 2 g via INTRAVENOUS
  Filled 2020-02-21: qty 2

## 2020-02-21 MED ORDER — VANCOMYCIN VARIABLE DOSE PER UNSTABLE RENAL FUNCTION (PHARMACIST DOSING): Status: DC

## 2020-02-21 MED ORDER — LACTATED RINGERS IV BOLUS
1000.0000 mL | Freq: Once | INTRAVENOUS | Status: AC
  Administered 2020-02-21: 20:00:00 1000 mL via INTRAVENOUS

## 2020-02-21 MED ORDER — VANCOMYCIN HCL 2000 MG/400ML IV SOLN
2000.0000 mg | Freq: Once | INTRAVENOUS | Status: AC
  Administered 2020-02-21: 18:00:00 2000 mg via INTRAVENOUS
  Filled 2020-02-21: qty 400

## 2020-02-21 MED ORDER — SODIUM BICARBONATE 4.2 % IV SOLN: 150.0000 meq | Freq: Once | INTRAVENOUS | Status: DC

## 2020-02-21 MED ORDER — SODIUM CHLORIDE 0.9 % IV SOLN
250.0000 mL | INTRAVENOUS | Status: DC
  Administered 2020-02-21: 17:00:00 250 mL via INTRAVENOUS

## 2020-02-21 MED ORDER — ORAL CARE MOUTH RINSE
15.0000 mL | OROMUCOSAL | Status: DC
  Administered 2020-02-21 – 2020-02-22 (×6): 15 mL via OROMUCOSAL

## 2020-02-21 MED ORDER — MIDAZOLAM 50MG/50ML (1MG/ML) PREMIX INFUSION
0.5000 mg/h | INTRAVENOUS | Status: DC
  Administered 2020-02-21: 19:00:00 1 mg/h via INTRAVENOUS
  Administered 2020-02-21: 17:00:00 0.5 mg/h via INTRAVENOUS
  Filled 2020-02-21 (×3): qty 50

## 2020-02-21 MED ORDER — HEPARIN (PORCINE) 25000 UT/250ML-% IV SOLN
1000.0000 [IU]/h | INTRAVENOUS | Status: DC
  Administered 2020-02-22: 01:00:00 1000 [IU]/h via INTRAVENOUS
  Filled 2020-02-21: qty 250

## 2020-02-21 MED ORDER — SODIUM BICARBONATE 8.4 % IV SOLN: 150.0000 meq | Freq: Once | INTRAVENOUS | Status: AC

## 2020-02-21 MED ORDER — NOREPINEPHRINE 16 MG/250ML-% IV SOLN
0.0000 ug/min | INTRAVENOUS | Status: DC
  Administered 2020-02-21: 23:00:00 2 ug/min via INTRAVENOUS
  Filled 2020-02-21: qty 250

## 2020-02-21 MED ORDER — SODIUM CHLORIDE 0.9% FLUSH
10.0000 mL | Freq: Two times a day (BID) | INTRAVENOUS | Status: DC
  Administered 2020-02-21: 22:00:00 10 mL

## 2020-02-21 MED ORDER — MIDAZOLAM HCL 2 MG/2ML IJ SOLN
2.0000 mg | Freq: Once | INTRAMUSCULAR | Status: AC
  Administered 2020-02-21: 16:00:00 2 mg via INTRAVENOUS

## 2020-02-21 MED ORDER — ETOMIDATE 2 MG/ML IV SOLN
INTRAVENOUS | Status: AC
  Filled 2020-02-21: qty 10

## 2020-02-21 MED ORDER — MIDAZOLAM HCL 2 MG/2ML IJ SOLN
INTRAMUSCULAR | Status: AC
  Filled 2020-02-21: qty 2

## 2020-02-21 MED ORDER — VECURONIUM BROMIDE 10 MG IV SOLR
10.0000 mg | Freq: Once | INTRAVENOUS | Status: AC
  Administered 2020-02-21: 16:00:00 10 mg via INTRAVENOUS

## 2020-02-21 MED ORDER — VASOPRESSIN 20 UNITS/100 ML INFUSION FOR SHOCK
0.0000 [IU]/min | INTRAVENOUS | Status: DC
  Administered 2020-02-21: 20:00:00 0.03 [IU]/min via INTRAVENOUS
  Administered 2020-02-22: 04:00:00 0.04 [IU]/min via INTRAVENOUS
  Filled 2020-02-21 (×2): qty 100

## 2020-02-21 MED ORDER — PHENYLEPHRINE CONCENTRATED 100MG/250ML (0.4 MG/ML) INFUSION SIMPLE
0.0000 ug/min | INTRAVENOUS | Status: DC
  Administered 2020-02-22: 200 ug/min via INTRAVENOUS
  Filled 2020-02-21 (×2): qty 250

## 2020-02-21 MED ORDER — NOREPINEPHRINE 4 MG/250ML-% IV SOLN
0.0000 ug/min | INTRAVENOUS | Status: DC
  Administered 2020-02-21: 22:00:00 30 ug/min via INTRAVENOUS
  Administered 2020-02-21: 20:00:00 18 ug/min via INTRAVENOUS
  Filled 2020-02-21: qty 250

## 2020-02-21 MED ORDER — METHYLPREDNISOLONE SODIUM SUCC 40 MG IJ SOLR
40.0000 mg | Freq: Three times a day (TID) | INTRAMUSCULAR | Status: DC
  Administered 2020-02-21: 22:00:00 40 mg via INTRAVENOUS
  Filled 2020-02-21: qty 1

## 2020-02-21 MED ORDER — SODIUM BICARBONATE 8.4 % IV SOLN
100.0000 meq | Freq: Once | INTRAVENOUS | Status: AC
  Administered 2020-02-22: 100 meq via INTRAVENOUS

## 2020-02-21 MED ORDER — LACTATED RINGERS IV SOLN: INTRAVENOUS | Status: DC

## 2020-02-21 MED ORDER — STERILE WATER FOR INJECTION IJ SOLN
INTRAMUSCULAR | Status: AC
  Filled 2020-02-21: qty 10

## 2020-02-21 MED ORDER — VECURONIUM BROMIDE 10 MG IV SOLR
INTRAVENOUS | Status: AC
  Filled 2020-02-21: qty 10

## 2020-02-21 MED ORDER — FENTANYL CITRATE (PF) 100 MCG/2ML IJ SOLN
100.0000 ug | Freq: Once | INTRAMUSCULAR | Status: AC
  Administered 2020-02-21: 16:00:00 100 ug via INTRAVENOUS

## 2020-02-21 MED ORDER — ETOMIDATE 2 MG/ML IV SOLN
20.0000 mg | Freq: Once | INTRAVENOUS | Status: AC
  Administered 2020-02-21: 16:00:00 20 mg via INTRAVENOUS

## 2020-02-21 MED ORDER — SODIUM BICARBONATE 8.4 % IV SOLN
50.0000 meq | Freq: Once | INTRAVENOUS | Status: AC
  Administered 2020-02-21: 20:00:00 50 meq via INTRAVENOUS
  Filled 2020-02-21: qty 50

## 2020-02-21 MED ORDER — SODIUM CHLORIDE 0.9 % IV SOLN
2.0000 g | Freq: Once | INTRAVENOUS | Status: AC
  Administered 2020-02-21: 17:00:00 2 g via INTRAVENOUS
  Filled 2020-02-21: qty 2

## 2020-02-21 MED ORDER — CHLORHEXIDINE GLUCONATE 0.12% ORAL RINSE (MEDLINE KIT)
15.0000 mL | Freq: Two times a day (BID) | OROMUCOSAL | Status: DC
  Administered 2020-02-21: 20:00:00 15 mL via OROMUCOSAL

## 2020-02-21 NOTE — Progress Notes (Signed)
Peripherally Inserted Central Catheter Placement  The IV Nurse has discussed with the patient and/or persons authorized to consent for the patient, the purpose of this procedure and the potential benefits and risks involved with this procedure.  The benefits include less needle sticks, lab draws from the catheter, and the patient may be discharged home with the catheter. Risks include, but not limited to, infection, bleeding, blood clot (thrombus formation), and puncture of an artery; nerve damage and irregular heartbeat and possibility to perform a PICC exchange if needed/ordered by physician.  Alternatives to this procedure were also discussed.  Bard Power PICC patient education guide, fact sheet on infection prevention and patient information card has been provided to patient /or left at bedside.  Phone consent obtain from wife Adel Burch  [facility] Placement Documentation  PICC Triple Lumen 92/11/94 PICC Right Basilic 45 cm 3 cm (Active)  Indication for Insertion or Continuance of Line Vasoactive infusions 02/21/20 1819  Exposed Catheter (cm) 3 cm 02/21/20 1819  Site Assessment Clean;Dry;Intact 02/21/20 1819  Lumen #1 Status Flushed;Saline locked;Blood return noted 02/21/20 1819  Lumen #3 Status Flushed;Saline locked;Blood return noted 02/21/20 1819  Dressing Type Transparent 02/21/20 1819  Dressing Status Clean;Dry;Intact 02/21/20 1819  Antimicrobial disc in place? Yes 02/21/20 1819  Dressing Intervention New dressing 02/21/20 1819  Dressing Change Due 02/28/20 02/21/20 1819       Gordan Payment 02/21/2020, 6:20 PM

## 2020-02-21 NOTE — Progress Notes (Signed)
  Chaplain On-Call responded to Rapid Response notification to room 238.  Unit Secretary informed Chaplain that the patient is being prepared for transfer to ICU bed 9.  Chaplain is available for spiritual support as needed.  [place] Endy Easterly M.Div., [facility]

## 2020-02-21 NOTE — Progress Notes (Signed)
Cardiologist on-call contacted due to mild ST elevation noted on ECG tracing and in 12-lead performed bedside.  Troponins elevated at 517.  Patient intubated and sedated unable to confirm symptoms. Case discussed with Dr. Saralyn Pilar.  P: -We will start heparin drip as patient has been unable to receive Eliquis p.o. due to lack of oral access -Trend troponin  Will continue to monitor patient closely.   Domingo Pulse [place], AGACNP-BC Acute Care Nurse Practitioner [facility]   [phone] / [phone] Please see Amion for pager details.

## 2020-02-21 NOTE — Plan of Care (Signed)
Problem: Education: Goal: Knowledge of General Education information will improve Description: Including pain rating scale, medication(s)/side effects and non-pharmacologic comfort measures 02/21/2020 0600 by Dossie Der, RN Outcome: Progressing  Problem: Health Behavior/Discharge Planning: Goal: Ability to manage health-related needs will improve 02/21/2020 0600 by Dossie Der, RN Outcome: Progressing  Problem: Clinical Measurements: Goal: Ability to maintain clinical measurements within normal limits will improve 02/21/2020 0600 by Dossie Der, RN Outcome: Progressing 02/21/2020 0600 by Dossie Der, RN Outcome: Progressing Goal: Will remain free from infection 02/21/2020 0600 by Dossie Der, RN Outcome: Progressing 02/21/2020 0600 by Dossie Der, RN Outcome: Progressing Goal: Diagnostic test results will improve 02/21/2020 0600 by Dossie Der, RN Outcome: Progressing 02/21/2020 0600 by Dossie Der, RN Outcome: Progressing Goal: Respiratory complications will improve 02/21/2020 0600 by Dossie Der, RN Outcome: Progressing 02/21/2020 0600 by Dossie Der, RN Outcome: Progressing Goal: Cardiovascular complication will be avoided 02/21/2020 0600 by Dossie Der, RN Outcome: Progressing 02/21/2020 0600 by Dossie Der, RN Outcome: Progressing   Problem: Activity: Goal: Risk for activity intolerance will decrease 02/21/2020 0600 by Dossie Der, RN Outcome: Progressing 02/21/2020 0600 by Dossie Der, RN Outcome: Progressing   Problem: Nutrition: Goal: Adequate nutrition will be maintained 02/21/2020 0600 by Dossie Der, RN Outcome: Not Progressing 02/21/2020 0600 by Dossie Der, RN Outcome: Progressing   Problem: Coping: Goal: Level of anxiety will decrease 02/21/2020 0600 by Dossie Der, RN Outcome: Not Progressing 02/21/2020 0600 by Dossie Der, RN Outcome: Progressing   Problem:  Elimination: Goal: Will not experience complications related to bowel motility 02/21/2020 0600 by Dossie Der, RN Outcome: Progressing 02/21/2020 0600 by Dossie Der, RN Outcome: Progressing Goal: Will not experience complications related to urinary retention 02/21/2020 0600 by Dossie Der, RN Outcome: Progressing 02/21/2020 0600 by Dossie Der, RN Outcome: Progressing   Problem: Pain Managment: Goal: General experience of comfort will improve 02/21/2020 0600 by Dossie Der, RN Outcome: Progressing 02/21/2020 0600 by Dossie Der, RN Outcome: Progressing   Problem: Safety: Goal: Ability to remain free from injury will improve 02/21/2020 0600 by Dossie Der, RN Outcome: Progressing 02/21/2020 0600 by Dossie Der, RN Outcome: Progressing   Problem: Skin Integrity: Goal: Risk for impaired skin integrity will decrease 02/21/2020 0600 by Dossie Der, RN Outcome: Progressing 02/21/2020 0600 by Dossie Der, RN Outcome: Progressing   Problem: Education: Goal: Knowledge of risk factors and measures for prevention of condition will improve 02/21/2020 0600 by Dossie Der, RN Outcome: Progressing 02/21/2020 0600 by Dossie Der, RN Outcome: Progressing   Problem: Coping: Goal: Psychosocial and spiritual needs will be supported 02/21/2020 0600 by Dossie Der, RN Outcome: Not Progressing 02/21/2020 0600 by Dossie Der, RN Outcome: Progressing   Problem: Respiratory: Goal: Will maintain a patent airway 02/21/2020 0600 by Dossie Der, RN Outcome: Progressing 02/21/2020 0600 by Dossie Der, RN Outcome: Progressing Goal: Complications related to the disease process, condition or treatment will be avoided or minimized 02/21/2020 0600 by Dossie Der, RN Outcome: Progressing 02/21/2020 0600 by Dossie Der, RN Outcome: Progressing   Problem: Education: Goal: Knowledge of risk factors and measures for  prevention of condition will improve 02/21/2020 0600 by Dossie Der, RN Outcome: Progressing 02/21/2020 0600 by Dossie Der, RN Outcome: Progressing   Problem: Coping: Goal: Psychosocial and spiritual needs will be supported 02/21/2020 0600 by Dossie Der, RN Outcome: Not Progressing 02/21/2020 0600 by Dossie Der, RN Outcome: Progressing   Problem: Respiratory: Goal: Will maintain a patent airway 02/21/2020 0600 by Dossie Der, RN Outcome: Progressing 02/21/2020 0600 by Dossie Der, RN Outcome: Progressing Goal: Complications  related to the disease process, condition or treatment will be avoided or minimized 02/21/2020 0600 by Dossie Der, RN Outcome: Progressing 02/21/2020 0600 by Dossie Der, RN Outcome: Progressing

## 2020-02-21 NOTE — Consult Note (Signed)
Name: Alfred Clements MRN: [id number] DOB: 09/07/1960     CONSULTATION DATE: 02/05/2020  REFERRING MD :  Enzo Bi  CHIEF COMPLAINT:  Acute resp distress   Patient was brought from the floor to the ICU with respiratory distress and unresponsive.  He was emergently intubated and sedated therefore no history could be obtained from the patient.  The following history and information was obtained from the progress notes by hospitalist service.  59 year old Hispanic male who was admitted on 02/08/2020 with COVID-19 and was found to be hypoxemic.  He was treated with steroids, remdesivir  And baricitinib.  His respiratory situation did not improve and he continued to require increasing oxygen requirement.  Today he developed higher FiO2 requirements and respiratory distress with mental status changes and was rushed to the ICU.  He was immediately intubated and sedated.  His hospital course was complicated by atrial fib with RVR.  He was started on Eliquis amiodarone Cardizem and Lopressor  and digoxin       PAST MEDICAL HISTORY :   has a past medical history of Depression, colonic polyps, and Hypertension.  has a past surgical history that includes Colonoscopy with propofol (N/A, 07/30/2019). Prior to Admission medications   Medication Sig Start Date End Date Taking? Authorizing Provider  FLUoxetine (PROZAC) 40 MG capsule Take 40 mg by mouth at bedtime. 12/26/19  Yes [provider]  gabapentin (NEURONTIN) 300 MG capsule Take 300 mg by mouth at bedtime.   Yes [provider]  hydrOXYzine (VISTARIL) 25 MG capsule Take 25 mg by mouth 2 (two) times daily. 12/26/19  Yes [provider]  Multiple Vitamin (MULTIVITAMIN) tablet Take 1 tablet by mouth daily.   Yes [provider]  naproxen (NAPROSYN) 500 MG tablet Take 1 tablet (500 mg total) by mouth 2 (two) times daily with a meal. 01/11/16  Yes Lavonia Drafts, MD   Allergies  Allergen Reactions  . Penicillins  Hives    FAMILY HISTORY:  family history is not on file. SOCIAL HISTORY:  reports that he has never smoked. He has never used smokeless tobacco. He reports that he does not drink alcohol and does not use drugs.  REVIEW OF SYSTEMS:   Unable to obtain due to critical illness Patient intubated sedated      Estimated body mass index is 26.23 kg/m as calculated from the following:   Height as of this encounter: 5' 9.5" (1.765 m).   Weight as of this encounter: 81.7 kg.    VITAL SIGNS: Temp:  [96.44 F (35.8 C)-98.6 F (37 C)] 97.7 F (36.5 C) (12/11 1815) Pulse Rate:  [32-80] 59 (12/11 1815) Resp:  [19-44] 28 (12/11 1815) BP: (64-112)/(36-83) 81/62 (12/11 1815) SpO2:  [74 %-99 %] 85 % (12/11 1815) FiO2 (%):  [90 %-100 %] 100 % (12/11 1548) Weight:  [81.7 kg] 81.7 kg (12/11 0400)   I/O last 3 completed shifts: In: 600 [P.O.:600] Out: 775 [Urine:775] Total I/O In: 8177 [I.V.:311.7; IV Piggyback:1122.3] Out: -    SpO2: (!) 85 % O2 Flow Rate (L/min): 50 L/min FiO2 (%): 100 %   Physical Examination:  GENERAL:critically ill appearing, + resp distress HEAD: Normocephalic, atraumatic.  EYES: Pupils equal, round, reactive to light.  No scleral icterus.  MOUTH: Moist mucosal membrane. NECK: Supple. No JVD.  PULMONARY: bilateral rhonci without wheezing CARDIOVASCULAR: S1 and S2. irregular rate and rhythm. No murmurs, rubs, or gallops.  GASTROINTESTINAL: Soft, nontender, -distended.  Positive bowel sounds.  MUSCULOSKELETAL: No swelling, clubbing, or edema.  NEUROLOGIC: obtunded SKIN:intact,warm,dry  I personally reviewed lab work that was obtained in last 24 hrs. ABG on vent 100% o2, peep 10 7.11/49/72 BUN/cr 76/2.25 LA 7.9 CXR Independently reviewed-bilateral diffuse infiltrates much worse on the lower lobes.  ET tube in good position.  MEDICATIONS: I have reviewed all medications and confirmed regimen as documented   CULTURE RESULTS   Recent Results (from the  past 240 hour(s))  MRSA PCR Screening     Status: None   Collection Time: 02/21/20  2:29 PM   Specimen: Nasopharyngeal  Result Value Ref Range Status   MRSA by PCR NEGATIVE NEGATIVE Final    Comment:        The GeneXpert MRSA Assay (FDA approved for NASAL specimens only), is one component of a comprehensive MRSA colonization surveillance program. It is not intended to diagnose MRSA infection nor to guide or monitor treatment for MRSA infections. Performed at [facility], [address] [place], [place] [address]           IMAGING    DG Abd 1 View  Result Date: 02/21/2020 CLINICAL DATA:  Attempted orogastric tube placement. EXAM: ABDOMEN - 1 VIEW COMPARISON:  February 18, 2020. FINDINGS: Stable gaseous distention of the colon is noted. No definite small bowel dilatation is noted. No nasogastric tube is noted. Residual stool is noted throughout the colon. IMPRESSION: Stable gaseous distention of the colon is noted. No definite small bowel dilatation is noted. No nasogastric tube is noted. Electronically Signed   By: Marijo Conception M.D.   On: 02/21/2020 16:12   DG Chest Port 1 View  Result Date: 02/21/2020 CLINICAL DATA:  Status post intubation. EXAM: PORTABLE CHEST 1 VIEW COMPARISON:  February 20, 2020. FINDINGS: Stable cardiomediastinal silhouette. Endotracheal tube is in grossly good position. No pneumothorax is noted. Stable bilateral lung opacities are noted concerning for multifocal pneumonia. No pleural effusion is noted. Subcutaneous emphysema is noted in the right cervical and supraclavicular soft tissues. Bony thorax is unremarkable. IMPRESSION: Endotracheal tube in grossly good position. Stable bilateral lung opacities are noted concerning for multifocal pneumonia. Subcutaneous emphysema is noted in the right cervical and supraclavicular soft tissues. Electronically Signed   By: Marijo Conception M.D.   On: 02/21/2020 16:11   [place] EKG SITE RITE  Result Date:  02/21/2020 If Site Rite image not attached, placement could not be confirmed due to current cardiac rhythm.    Nutrition Status: Nutrition Problem: Increased nutrient needs Etiology: catabolic illness (COVID 19) Signs/Symptoms: estimated needs       Indwelling Urinary Catheter continued, requirement due to   Reason to continue Indwelling Urinary Catheter strict Intake/Output monitoring for hemodynamic instability   Central Line/ continued, requirement due to  Reason to continue [place] of central venous pressure or other hemodynamic parameters and poor IV access   Ventilator continued, requirement due to severe respiratory failure   Ventilator Sedation RASS 0 to -2      ASSESSMENT AND PLAN SYNOPSIS   Severe ACUTE Hypoxic and Hypercapnic Respiratory Failure -continue Full MV support -continue Bronchodilator Therapy -Wean Fio2 and PEEP as tolerated -will perform SAT/SBT when respiratory parameters are met -VAP/VENT bundle implementation May need to be proned  ACUTE COVID-19 pneumonia with ARDS Continue steroids continue Baricitinib/Remdisivir completed  HCAP rx with cefepime/vanco  Septic shock related  to covid 19/HCAP  Follow LA, cont IVF Cont pressors Bicarb one ampule for ph 7.11  Atrial fib with RVR Rate controlling medications on hold due to  hypotension Continue  digoxin   Acute anxiety/depression  Patient sedated and intubated at present   ACUTE KIDNEY INJURY/Renal Failure -continue Foley Catheter-assess need -Avoid nephrotoxic agents -Follow urine output, BMP -Ensure adequate renal perfusion, optimize oxygenation -Renal dose medications IVF Consider renal consult     NEUROLOGY - intubated and sedated - minimal sedation to achieve a RASS goal: -1 Wake up assessment pending   CARDIAC ICU monitoring  ID -continue IV abx as prescibed -follow up cultures  GI GI PROPHYLAXIS as indicated  NUTRITIONAL  STATUS DIET-->TF's as tolerated once NG/OG placed Constipation protocol as indicated   ENDO - will use ICU hypoglycemic\Hyperglycemia protocol if needed    ELECTROLYTES -follow labs as needed -replace as needed -pharmacy consultation and following    DVT/GI PRX ordered and assessed TRANSFUSIONS AS NEEDED MONITOR FSBS I Assessed the need for Labs I Assessed the need for Foley I Assessed the need for Central Venous Line Family Discussion when available I Assessed the need for Mobilization I made an Assessment of medications to be adjusted accordingly Safety Risk assessment Completed      Critical Care Time devoted to patient care services described in this note is  60  minutes.  Excluding any procedure time  D/w wife and staff in detail  Overall, patient is critically ill, prognosis is guarded.  Patient with Multiorgan failure and at high risk for cardiac arrest and death   Rosine Door, MD 16-Dec-20216:38 PM

## 2020-02-21 NOTE — Progress Notes (Signed)
Pt very withdrawn w/ flat affect. Needs encouragement to take medications. No complaints at this time. Poor oral intake. Maintaining O2 sat 94% throughout shift. Will continue to monitor.

## 2020-02-21 NOTE — Progress Notes (Signed)
PROGRESS NOTE    Alfred Clements  CBU:[id number] DOB: 09/18/1960 DOA: 01/30/2020 PCP: Baxter Hire, MD    Brief Narrative:  FernandoGarciais a59 y.o.Hispanic malewith a known history of depression and hypertension, who presented to the emergency room with acute onset of dyspnea and feveras well as dry cough and wheezing since Monday. He admits to fever and chills as well as loss of taste and smell. He has been having fatigue and tiredness. He admitted to diarrhea with bright red bleeding per rectum or melena. No nausea or vomiting.He has been exposed to hisson who wasdiagnosed with COVID-19 a couple days ago. 2 daughters also have similar symptoms. The patient was noted to have hypoxemia with pulseoximetryhas been ranging 88 to 89% on room air. The patient has not been vaccinated for COVID-19. As of 02/11/2020 patient remains in progressive cardiac unit dependent on heated high flow nasal cannula.  He is mentating clearly though he looks fatigued.  Normal work of breathing.  12/2: Patient very anxious and tearful this morning.  Repeatedly request to go home.  Explained to him in Spanish that he was markedly hypoxic and that discharge home at this time would not be safe in any way.  He expressed understanding. 12/3: Oxygen status improved over interval.  Wean from heated high flow nasal cannula to high flow nasal cannula at 15 L.  Still anxious but less.  Remains in rapid atrial fibrillation 12/4: Oxygen rate weaned to 11 L.  Patient seems very depressed and anxious still.  Has been unable to speak to his wife for unclear reasons.  I asked the nurse to put them in touch. 12/5: Oxygen rate at 10 L.  Continues to be depressed.  Long conversation with the patient's wife via phone today.  Reassured her that she continues to make daily progress as far as respiratory status goes.  Patient remains very depressed. 12/6: Oxygen remained at 10 to 11 L. Continues to express depression and  anxiety. Remains in rapid atrial fibrillation. 12/7: Oxygen remains at 8 L nasal cannula.  Respiratory status slowly improving.  In rapid atrial fibrillation.  Digoxin added by cardiology today.   Assessment & Plan:   Active Problems:   Atypical pneumonia  # Multifocal pneumonia secondary to COVID-19 # Severe acute hypoxic respiratory failure secondary to above # Intubated Oxygen saturation on admission 86% Oxygen requirement has gone up substantially since then Completed course of remdesivir in house Weaned off heated high flow nasal cannula 12/7: Weaned to 8 L high flow --12/11, back on heated hf --CRP trending up to 31 despite being on solumedrol 40 mg q8 --today, pt was desating and becoming unresponsive despite being on heated hf.  Pt transferred to ICU and was intubated emergently. Plan: --on the vent, being managed by ICU team --cont solumedrol 60 mg q8h due to persistently high CRP --Monitor and ensure CRP down-trending Continue baricitinib Stress I-S and flutter use Out of bed to chair as tolerated Combivent 4 times daily --Continue supplemental O2 to keep sats >=88%, wean as tolerated  Superimposed bacterial PNA --Procal elevated to 3.64 this morning, up from 0.2 three days ago.  Together with acute worsening of hypoxia, strong concern for superimposed bacterial PNA. Plan: --blood cx x2 --start vanc/cefepime  Acute anxiety/depression 02/12/20 patient very tearful and anxious Wishes to go home Requests medication for antianxiety Wife is concerned that medication could be worsening or causing delirium, Xanax d/c'ed Plan: --sedated by ICU protocol  Atrial fibrillation with rapid ventricular response No documented  history of arrhythmia Development possibly due to Covid infection and hypoxia Cardiology following, not recommending cardioversion --rate finally appeared controlled and Afib converted on 12/9. --was on oral amiodarone 200 mg BID, Cardizem 60 mg every 6  hours, Lopressor 50 mg BID and IV digoxin daily, per cardiology Plan: --Pt now intubated.  Rate control per ICU team --cont Eliquis  Hypokalemia --monitor and replete PRN  Hyponatremia, resolved Monitor  Essential hypertension Not on home BP medications --cont dilt and metop  GERD Pepcid  Hyperglycemia DM2, new dx --A1c 6.5 --cont Lantus 8u daily --cont mealtime 3u TID --SSI TID  Poor oral intake --cont Remeron and Reglan  --Ensure TID --allow family to bring food if pt wants  Lactic acidosis --lactic acid has been between 2-3 since 12/8, despite getting MIVF@100  for 10 hours for 2 days.  Procal 0.2 on 12/8, unchanged from 2 weeks ago.  CXR 12/10 unchanged from 12/8.  --trend lactic acid   DVT prophylaxis: Lovenox Code Status: Full Family Communication: wife updated about pt's worsening condition before transfer, and called and updated wife again after pt became intubated.  Disposition Plan: Status is: Inpatient  Remains inpatient appropriate because:Inpatient level of care appropriate due to severity of illness   Dispo: The patient is from: Home              Anticipated d/c is to: Home              Anticipated d/c date is: >3 days              Patient currently is not medically stable to d/c.  Worsening hypoxia now intubated.     Consultants:   Cardiology-[facility]  Procedures:   None  Antimicrobials:   None   Subjective: Procal become elevated this morning, so started on abx for presume PNA.  Pt was found to be desating on heated hf, and unable to bring sats up even with BiPAP.  Pt was becoming more somnolent, so rapid was called to help transfer pt to ICU.  On rrival, pt became unresponsive, so was intubated by ICU attending emergently.    I talked with wife on the phone before and after the transfer and gave her the unfortunate grim prognosis.   Objective: Vitals:   02/21/20 1730 02/21/20 1745 02/21/20 1800 02/21/20 1815  BP: (!) 83/62  (!) 79/42 (!) 80/62 (!) 81/62  Pulse: 60 62 64 (!) 59  Resp: (!) 22 (!) 22 (!) 22 (!) 28  Temp: (!) 97.52 F (36.4 C) (!) 97.52 F (36.4 C) (!) 97.52 F (36.4 C) 97.7 F (36.5 C)  TempSrc:      SpO2: (!) 88% (!) 88% (!) 88% (!) 85%  Weight:      Height:        Intake/Output Summary (Last 24 hours) at 02/21/2020 1854 Last data filed at 02/21/2020 1800 Gross per 24 hour  Intake 1674.04 ml  Output 0 ml  Net 1674.04 ml   Filed Weights   02/19/20 0548 02/20/20 0318 02/21/20 0400  Weight: 85 kg 85.4 kg 81.7 kg    Examination:  Constitutional: lethargic, becoming unresponsive HEENT: conjunctivae and lids normal, EOMI RESP: increased RR, in distress, desating on heated hf Extremities: No effusions, edema in BLE SKIN: dry and intact   Data Reviewed: I have personally reviewed following labs and imaging studies  CBC: Recent Labs  Lab 02/17/20 0346 02/18/20 0436 02/19/20 0430 02/20/20 0730 02/21/20 0519  WBC 30.5* 26.6* 21.0* 18.4* 17.3*  NEUTROABS  --  25.2*  --   --   --   HGB 15.3 14.5 13.3 12.7* 12.0*  HCT 46.0 44.5 40.4 38.8* 36.2*  MCV 97.5 96.3 96.2 98.0 96.0  PLT 488* 407* 288 199 284   Basic Metabolic Panel: Recent Labs  Lab 02/15/20 0718 02/18/20 0436 02/18/20 0600 02/19/20 0430 02/20/20 0730 02/21/20 0519  NA 135 136  --  137 135 136  K 4.7 4.5  --  4.7 4.9 5.1  CL 97* 99  --  103 99 99  CO2 28 27  --  24 23 22   GLUCOSE 135* 91  --  77 176* 249*  BUN 45* 49*  --  50* 51* 76*  CREATININE 1.13 1.30*  --  1.28* 1.38* 2.25*  CALCIUM 8.4* 8.4*  --  8.0* 8.3* 8.2*  MG  --   --  2.7* 2.5* 2.6* 2.7*   GFR: Estimated Creatinine Clearance: 35.1 mL/min (A) (by C-G formula based on SCr of 2.25 mg/dL (H)). Liver Function Tests: No results for input(s): AST, ALT, ALKPHOS, BILITOT, PROT, ALBUMIN in the last 168 hours. No results for input(s): LIPASE, AMYLASE in the last 168 hours. No results for input(s): AMMONIA in the last 168 hours. Coagulation  Profile: No results for input(s): INR, PROTIME in the last 168 hours. Cardiac Enzymes: No results for input(s): CKTOTAL, CKMB, CKMBINDEX, TROPONINI in the last 168 hours. BNP (last 3 results) No results for input(s): PROBNP in the last 8760 hours. HbA1C: No results for input(s): HGBA1C in the last 72 hours. CBG: Recent Labs  Lab 02/20/20 2048 02/21/20 0858 02/21/20 1218 02/21/20 1534 02/21/20 1713  GLUCAP 238* 268* 312* 268* 217*   Lipid Profile: No results for input(s): CHOL, HDL, LDLCALC, TRIG, CHOLHDL, LDLDIRECT in the last 72 hours. Thyroid Function Tests: No results for input(s): TSH, T4TOTAL, FREET4, T3FREE, THYROIDAB in the last 72 hours. Anemia Panel: No results for input(s): VITAMINB12, FOLATE, FERRITIN, TIBC, IRON, RETICCTPCT in the last 72 hours. Sepsis Labs: Recent Labs  Lab 02/18/20 0600 02/18/20 0714 02/19/20 1124 02/20/20 1126 02/21/20 0519 02/21/20 1429 02/21/20 1735  PROCALCITON 0.20  --   --   --  3.64  --   --   LATICACIDVEN  --    < > 2.4* 2.6*  --  >11.0* 7.9*   < > = values in this interval not displayed.    Recent Results (from the past 240 hour(s))  MRSA PCR Screening     Status: None   Collection Time: 02/21/20  2:29 PM   Specimen: Nasopharyngeal  Result Value Ref Range Status   MRSA by PCR NEGATIVE NEGATIVE Final    Comment:        The GeneXpert MRSA Assay (FDA approved for NASAL specimens only), is one component of a comprehensive MRSA colonization surveillance program. It is not intended to diagnose MRSA infection nor to guide or monitor treatment for MRSA infections. Performed at [facility], [address] [place], [place] [address]          Radiology Studies: DG Abd 1 View  Result Date: 02/21/2020 CLINICAL DATA:  Attempted orogastric tube placement. EXAM: ABDOMEN - 1 VIEW COMPARISON:  February 18, 2020. FINDINGS: Stable gaseous distention of the colon is noted. No definite small bowel dilatation is noted. No  nasogastric tube is noted. Residual stool is noted throughout the colon. IMPRESSION: Stable gaseous distention of the colon is noted. No definite small bowel dilatation is noted. No nasogastric tube is noted.  Electronically Signed   By: Marijo Conception M.D.   On: 02/21/2020 16:12   DG Chest Port 1 View  Result Date: 02/21/2020 CLINICAL DATA:  Status post intubation. EXAM: PORTABLE CHEST 1 VIEW COMPARISON:  February 20, 2020. FINDINGS: Stable cardiomediastinal silhouette. Endotracheal tube is in grossly good position. No pneumothorax is noted. Stable bilateral lung opacities are noted concerning for multifocal pneumonia. No pleural effusion is noted. Subcutaneous emphysema is noted in the right cervical and supraclavicular soft tissues. Bony thorax is unremarkable. IMPRESSION: Endotracheal tube in grossly good position. Stable bilateral lung opacities are noted concerning for multifocal pneumonia. Subcutaneous emphysema is noted in the right cervical and supraclavicular soft tissues. Electronically Signed   By: Marijo Conception M.D.   On: 02/21/2020 16:11   DG Chest Port 1 View  Result Date: 02/20/2020 CLINICAL DATA:  Worsening hypoxia COVID positive EXAM: PORTABLE CHEST 1 VIEW COMPARISON:  02/18/2020, 02/15/2020, 01/26/2020 FINDINGS: Low lung volumes. Fairly extensive bilateral lung consolidations and ground-glass opacities, not much interval change since 02/18/2020 but progressed compared to 02/15/2020. Stable cardiomediastinal silhouette. No pneumothorax. IMPRESSION: Fairly extensive bilateral lung consolidations and ground-glass opacities, not much changed since 02/18/2020 but progressed compared to 02/15/2020. Electronically Signed   By: Donavan Foil M.D.   On: 02/20/2020 16:09   [place] EKG SITE RITE  Result Date: 02/21/2020 If Site Rite image not attached, placement could not be confirmed due to current cardiac rhythm.       Scheduled Meds: . amiodarone  200 mg Oral BID  . apixaban  5 mg  Oral BID  . vitamin C  500 mg Oral Daily  . aspirin EC  81 mg Oral Daily  . chlorhexidine gluconate (MEDLINE KIT)  15 mL Mouth Rinse BID  . Chlorhexidine Gluconate Cloth  6 each Topical Daily  . digoxin  0.125 mg Intravenous Daily  . diltiazem  60 mg Oral Q6H  . famotidine  20 mg Oral BID  . feeding supplement  237 mL Oral TID BM  . fentaNYL      . FLUoxetine  40 mg Oral QHS  . guaiFENesin  600 mg Oral BID  . insulin aspart  0-15 Units Subcutaneous TID WC  . insulin aspart  3 Units Subcutaneous TID WC  . insulin glargine  8 Units Subcutaneous Daily  . Ipratropium-Albuterol  1 puff Inhalation QID  . mouth rinse  15 mL Mouth Rinse 10 times per day  . methylPREDNISolone (SOLU-MEDROL) injection  60 mg Intravenous Q8H  . metoCLOPramide  10 mg Oral TID AC  . metoprolol tartrate  50 mg Oral BID  . midazolam      . mirtazapine  15 mg Oral QHS  . multivitamin with minerals  1 tablet Oral Daily  . polyethylene glycol  34 g Oral BID  . sodium chloride flush  10-40 mL Intracatheter Q12H  . sterile water (preservative free)      . vancomycin variable dose per unstable renal function (pharmacist dosing)   Does not apply See admin instructions  . vecuronium      . zinc sulfate  220 mg Oral Daily   Continuous Infusions: . sodium chloride Stopped (02/21/20 1753)  . [START ON March 23, 2020] ceFEPime (MAXIPIME) IV    . lactated ringers 100 mL/hr at 02/21/20 1814  . midazolam 1 mg/hr (02/21/20 1800)  . norepinephrine    . norepinephrine (LEVOPHED) Adult infusion 12 mcg/min (02/21/20 1800)  . vancomycin 200 mL/hr at 02/21/20 1800     LOS: 17  days    Additional bill for critical care 45 minutes, at the bed side, managing pt's worsening hypoxia and transfer to ICU.   Enzo Bi, MD [facility] Pager [phone]  If 7PM-7AM, please contact night-coverage 02/21/2020, 6:54 PM

## 2020-02-21 NOTE — Procedures (Signed)
Endotracheal Intubation: Patient required placement of an artificial airway secondary to Respiratory Failure  Consent: Emergent.   Hand washing performed prior to starting the procedure.   Medications administered for sedation prior to procedure:  Midazolam 4 mg IV,  Vecuronium 10 mg IV, Fentanyl 100 mcg IV.    A time out procedure was called and correct patient, name, & ID confirmed. Needed supplies and equipment were assembled and checked to include ETT, 10 ml syringe, Glidescope, Mac and Miller blades, suction, oxygen and bag mask valve, end tidal CO2 monitor.   Patient was positioned to align the mouth and pharynx to facilitate visualization of the glottis.   Heart rate, SpO2 and blood pressure was continuously monitored during the procedure. Pre-oxygenation was conducted prior to intubation and endotracheal tube was placed through the vocal cords into the trachea.     The artificial airway was placed under direct visualization via glidescope route using a size 8.0 ETT on the first attempt.  ETT was secured at 23 cm mark.  Placement was confirmed by auscuitation of lungs with good breath sounds bilaterally and no stomach sounds.  Condensation was noted on endotracheal tube.   Pulse ox 88%.  CO2 detector in place with appropriate color change.   Complications: None .   Operator: Kasa.   Chest radiograph shows good placement of the ET tube.       Rosine Door, MD  02/21/2020 7:21 PM [facility]

## 2020-02-21 NOTE — Consult Note (Signed)
Pharmacy Antibiotic Note  Alfred Clements is a 59 y.o. male admitted on 01/17/2020 with pneumonia.  Pharmacy has been consulted for Cefepime/Vancomycin dosing. Patient's baseline Scr 1.34 mg/dL on 01/30/2020 and current Scr is 2.25 mg/dL (significantly up from yesterday).   Plan:  Vancomycin 2g x1 dose. Given renal function will defer a maintenance dose for now and based dosing on renal function. Will obtain MRSA PCR - dicussed with patient nurse Charlett Nose. Will follow up on Scr with AM labs tomorrow.   Cefepime 2g Q12H  Height: 5' 9.5" (176.5 cm) Weight: 81.7 kg (180 lb 3.2 oz) IBW/kg (Calculated) : 71.85  Temp (24hrs), Avg:98.1 F (36.7 C), Min:97.6 F (36.4 C), Max:98.6 F (37 C)  Recent Labs  Lab 02/15/20 0718 02/17/20 0346 02/18/20 0436 02/18/20 0714 02/18/20 1002 02/19/20 0430 02/19/20 1124 02/20/20 0730 02/20/20 1126 02/21/20 0519  WBC  --  30.5* 26.6*  --   --  21.0*  --  18.4*  --  17.3*  CREATININE 1.13  --  1.30*  --   --  1.28*  --  1.38*  --  2.25*  LATICACIDVEN  --   --   --  2.8* 3.2*  --  2.4*  --  2.6*  --     Estimated Creatinine Clearance: 35.1 mL/min (A) (by C-G formula based on SCr of 2.25 mg/dL (H)).    Allergies  Allergen Reactions  . Penicillins Hives    Antimicrobials this admission: 12/11 Cefepime  >>  12/11 Vancomycin >>   Dose adjustments this admission:   Microbiology results: 12/11 BCx: pending 12/11 MRSA PCR: pending  Thank you for allowing pharmacy to be a part of this patient's care.  Rowland Lathe 02/21/2020 2:36 PM

## 2020-02-21 NOTE — Progress Notes (Signed)
Pt desating into 70's. O2 increased to 100% 50L. NRB applied. Current O2 sats 84-90%. Resp 32-40/min. Respiratory aware and will apply bipap. Dr. Billie Ruddy to put in transfer orders, will contnue to monitor

## 2020-02-22 ENCOUNTER — Inpatient Hospital Stay: Payer: Medicare Other

## 2020-02-22 ENCOUNTER — Encounter: Payer: Self-pay | Admitting: Family Medicine

## 2020-02-22 LAB — BLOOD CULTURE ID PANEL (REFLEXED) - BCID2

## 2020-02-22 LAB — COMPREHENSIVE METABOLIC PANEL
ALT: 506 U/L — ABNORMAL HIGH (ref 0–44)
AST: 807 U/L — ABNORMAL HIGH (ref 15–41)
Albumin: 1.3 g/dL — ABNORMAL LOW (ref 3.5–5.0)
Alkaline Phosphatase: 85 U/L (ref 38–126)
Anion gap: 17 — ABNORMAL HIGH (ref 5–15)
BUN: 88 mg/dL — ABNORMAL HIGH (ref 8–23)
CO2: 22 mmol/L (ref 22–32)
Calcium: 6.5 mg/dL — ABNORMAL LOW (ref 8.9–10.3)
Chloride: 101 mmol/L (ref 98–111)
Creatinine, Ser: 3.6 mg/dL — ABNORMAL HIGH (ref 0.61–1.24)
GFR, Estimated: 18 mL/min — ABNORMAL LOW (ref >60)
Glucose, Bld: 119 mg/dL — ABNORMAL HIGH (ref 70–99)
Potassium: 5.2 mmol/L — ABNORMAL HIGH (ref 3.5–5.1)
Sodium: 140 mmol/L (ref 135–145)
Total Bilirubin: 0.8 mg/dL (ref 0.3–1.2)
Total Protein: 4 g/dL — ABNORMAL LOW (ref 6.5–8.1)

## 2020-02-22 LAB — CBC
HCT: 28.3 % — ABNORMAL LOW (ref 39.0–52.0)
Hemoglobin: 9.2 g/dL — ABNORMAL LOW (ref 13.0–17.0)
MCH: 32.3 pg (ref 26.0–34.0)
MCHC: 32.5 g/dL (ref 30.0–36.0)
MCV: 99.3 fL (ref 80.0–100.0)
Platelets: 131 10*3/uL — ABNORMAL LOW (ref 150–400)
RBC: 2.85 MIL/uL — ABNORMAL LOW (ref 4.22–5.81)
RDW: 13.3 % (ref 11.5–15.5)
WBC: 2.4 10*3/uL — ABNORMAL LOW (ref 4.0–10.5)
nRBC: 0 % (ref 0.0–0.2)

## 2020-02-22 LAB — TROPONIN I (HIGH SENSITIVITY): Troponin I (High Sensitivity): 2019 ng/L (ref <18)

## 2020-02-22 MED ORDER — ALBUMIN HUMAN 25 % IV SOLN
50.0000 g | Freq: Once | INTRAVENOUS | Status: AC
  Administered 2020-02-22: 03:00:00 50 g via INTRAVENOUS
  Filled 2020-02-22: qty 200

## 2020-02-22 MED ORDER — ALBUMIN HUMAN 25 % IV SOLN: 50.0000 g | Freq: Once | INTRAVENOUS | Status: DC

## 2020-02-22 MED ORDER — SODIUM CHLORIDE 0.9 % IV SOLN
2.0000 g | INTRAVENOUS | Status: DC
  Filled 2020-02-22: qty 2

## 2020-02-22 MED ORDER — EPINEPHRINE 0.1 MG/10ML (10 MCG/ML) SYRINGE FOR IV PUSH (FOR BLOOD PRESSURE SUPPORT): 5.0000 ug | PREFILLED_SYRINGE | INTRAVENOUS | Status: DC

## 2020-02-22 MED ORDER — ALBUMIN HUMAN 25 % IV SOLN
75.0000 g | Freq: Once | INTRAVENOUS | Status: AC
  Administered 2020-02-22: 06:00:00 75 g via INTRAVENOUS
  Filled 2020-02-22: qty 300

## 2020-02-22 MED ORDER — ATROPINE SULFATE 1 MG/10ML IJ SOSY
1.0000 mg | PREFILLED_SYRINGE | Freq: Once | INTRAMUSCULAR | Status: AC
  Administered 2020-02-22: 03:00:00 1 mg via INTRAVENOUS

## 2020-02-22 MED ORDER — EPINEPHRINE HCL 5 MG/250ML IV SOLN IN NS
0.5000 ug/min | INTRAVENOUS | Status: DC
  Administered 2020-02-22: 20 ug/min via INTRAVENOUS
  Filled 2020-02-22 (×2): qty 250

## 2020-02-24 MED FILL — Medication: Qty: 1 | Status: AC

## 2020-02-25 LAB — CULTURE, BLOOD (ROUTINE X 2): Special Requests: ADEQUATE

## 2020-03-13 NOTE — Progress Notes (Signed)
  Chaplain On-Call was paged by ICU Unit Secretary with report of patient's death, and family members are sitting outside patient's room.  Chaplain arrived at the Unit and learned that the family had left [place] go to the airport to meet other family members arriving this morning.  Staff informed Chaplain that the family wants to view the patient's body, which will remain in the room due to no available space in the [place].  This Chaplain will notify the next shift Chaplain at 0830 about this plan.  Bogart Jeniel Slauson M.Div., [facility]

## 2020-03-13 NOTE — Significant Event (Signed)
CODE BLUE  The patient's heart rate became bradycardic, and then at 5:05 he went into asystole and CPR was started. During each pulse check the patient's rhythm was asystole, therefore no shocks delivered. ROSC achieved at 5:14, patient in an accelerated idioventricular rhythm.  Meds given: -2 amp of bicarb -1 amp of calcium chloride -3 amps of epinephrine -Patient also has epinephrine, Levophed, vasopressin and Neo-Synephrine drips running  Wife and daughter are onsite and alerted of cardiac arrest.  At this time they declined to come bedside.   Domingo Pulse Rust-Chester, AGACNP-BC Acute Care Nurse Practitioner [facility]   [phone] / [phone] Please see Amion for pager details.

## 2020-03-13 NOTE — Progress Notes (Signed)
CODE BLUE activated at 06:00 and CPR initiated. Initial rhythm: asystole.  Meds given: - 2 amps of Epinephrine  Wife and daughter present for resuscitation efforts, requested to stop resuscitation efforts at 6:04. Time of death: 06:05.  Wife and daughter at bedside.   Domingo Pulse Rust-Chester, AGACNP-BC Acute Care Nurse Practitioner [facility]   [phone] / [phone] Please see Amion for pager details.

## 2020-03-13 NOTE — Progress Notes (Signed)
eLink Physician-Brief Progress Note Patient Name: Alfred Clements DOB: 04-18-1960 MRN: [id number]   Date of Service  Mar 07, 2020  HPI/Events of Note  Patient with severe septic shock and multiple organ systems failure secondary to end stage Covid 19 pneumonia + sepsis, serum lactate is 11 and has been unresponsive to aggressive volume resuscitation. Patient is maxed out on 3 pressors. Darryl Nestle is not available at [facility].  eICU Interventions  Add Epinephrine infusion, Albumin 5 % bolus, Bicarbonate infusion for PH < 7.2, stat CMP , patient is too unstable for stat abdominal CT to r/o an intra-abdominal acute abdomen accounting for the profound lactic acidosis and would not be a surgical candidate in any case, goals of care discussion with family.        Chau Savell U Adonica Fukushima 2020-03-07, 1:01 AM

## 2020-03-13 NOTE — Death Summary Note (Signed)
DEATH SUMMARY   Patient Details  Name: Alfred Clements MRN: [id number] DOB: Jan 19, 1961  Admission/Discharge Information   Admit Date:  02/29/2020  Date of Death:    Time of Death:    Length of Stay: 06/23/22  Referring Physician: Baxter Hire, MD   Reason(s) for Hospitalization  Shortness of breath in the setting of COVID-19 pneumonia  Diagnoses  Preliminary cause of death:  Secondary Diagnoses (including complications and co-morbidities):  Active Problems:   Atypical pneumonia   Acute respiratory failure due to COVID-19 [facility])   Septic shock ([place])   Acute kidney injury [facility])   Brief Hospital Course (including significant findings, care, treatment, and services provided and events leading to death)  Alfred Clements is a 60 y.o. year old male who presented to the emergency room with acute onset of dyspnea and fever as well as dry cough and wheezing since 02/02/20.  He had been exposed to his son who was diagnosed with COVID-19 a few days prior, his 2 daughters also have similar symptoms.  The patient was Covid positive on arrival and hypoxic with an SPO2 in the high 80s on room air.  He had not been vaccinated for COVID-19.  The patient was initially admitted to the progressive cardiac unit dependent on heated high flow nasal cannula.  During this time he developed atrial fibrillation with rapid ventricular response.  Patient had a desaturation event into the 70s on 02/21/20 was placed on a nonrebreather in addition to heated high flow nasal cannula at maximum settings and eventually transferred to BiPAP.  Upon arrival to the ICU on 02/21/2020 patient required emergent intubation for acute hypoxic respiratory failure.  Overnight from 02/21/20 to 18-Mar-2020 the patient deteriorated developing multisystem organ failure: Elevated troponin, acute renal failure, elevated liver enzymes and septic shock.  Patient treated with 4 continuous vasopressors, IV fluid resuscitation, maximum ventilator  settings and antibiotics.  He did not improve and cardiac arrested in asystole, ROSC was achieved after 10 minutes of CPR.  Unfortunately 45 minutes later the patient cardiac arrested and asystole again with wife and daughter bedside after 2 rounds of CPR and epinephrine the wife requested that CPR be stopped.  Patient was in asystole and time of death confirmed at 06:05.  Pertinent Labs and Studies  Significant Diagnostic Studies DG Chest 2 View  Result Date: 2020-02-29 CLINICAL DATA:  60 year old male with shortness of breath. EXAM: CHEST - 2 VIEW COMPARISON:  None FINDINGS: Bilateral streaky pulmonary densities may represent edema but concerning for atypical infection. Clinical correlation is recommended. No focal consolidation, pleural effusion, pneumothorax. The cardiac silhouette is within limits. No acute osseous pathology. IMPRESSION: Findings may represent edema but concerning for atypical infection. Clinical correlation is recommended. Electronically Signed   By: Anner Crete M.D.   On: Feb 29, 2020 19:26   DG Abd 1 View  Result Date: Mar 18, 2020 CLINICAL DATA:  NG tube placement EXAM: ABDOMEN - 1 VIEW COMPARISON:  02/21/2020 FINDINGS: NG tube coils in the fundus of the stomach. Diffuse gaseous distention of the colon, unchanged. IMPRESSION: NG tube coils in the fundus of the stomach. Stable diffuse gaseous distention of the colon. Electronically Signed   By: Rolm Baptise M.D.   On: 2020-03-18 01:05   DG Abd 1 View  Result Date: 02/21/2020 CLINICAL DATA:  Attempted orogastric tube placement. EXAM: ABDOMEN - 1 VIEW COMPARISON:  February 18, 2020. FINDINGS: Stable gaseous distention of the colon is noted. No definite small bowel dilatation is noted. No nasogastric tube is noted.  Residual stool is noted throughout the colon. IMPRESSION: Stable gaseous distention of the colon is noted. No definite small bowel dilatation is noted. No nasogastric tube is noted. Electronically Signed   By: Marijo Conception M.D.   On: 02/21/2020 16:12   DG Abd 1 View  Result Date: 02/18/2020 CLINICAL DATA:  Vomiting and abdominal pain. EXAM: ABDOMEN - 1 VIEW COMPARISON:  CT scan 01/11/2016 FINDINGS: Moderate diffuse gaseous distention of the colon with diffuse nodularity. This could be adherent stool or colonic polyposis. No free air is identified. No distended small bowel loops to suggest obstruction. IMPRESSION: Moderate gaseous distention of the colon with diffuse nodularity. This could be adherent stool or colonic polyposis. Electronically Signed   By: Marijo Sanes M.D.   On: 02/18/2020 08:27   DG Chest Port 1 View  Result Date: 02-28-2020 CLINICAL DATA:  NG tube placement EXAM: PORTABLE CHEST 1 VIEW COMPARISON:  02/21/2020 FINDINGS: Endotracheal tube is in stable position. NG tube coils in the stomach. Extensive bilateral airspace disease, unchanged since prior study. Heart is normal size. No visible effusions. Diffuse gaseous distention of colon seen within the upper abdomen. IMPRESSION: NG tube coils in the stomach. Stable diffuse bilateral airspace disease. Stable gaseous distention of bowel in the upper abdomen which appears to be colon. Electronically Signed   By: Rolm Baptise M.D.   On: Feb 28, 2020 01:08   DG Chest Port 1 View  Result Date: 02/21/2020 CLINICAL DATA:  Status post intubation. EXAM: PORTABLE CHEST 1 VIEW COMPARISON:  February 20, 2020. FINDINGS: Stable cardiomediastinal silhouette. Endotracheal tube is in grossly good position. No pneumothorax is noted. Stable bilateral lung opacities are noted concerning for multifocal pneumonia. No pleural effusion is noted. Subcutaneous emphysema is noted in the right cervical and supraclavicular soft tissues. Bony thorax is unremarkable. IMPRESSION: Endotracheal tube in grossly good position. Stable bilateral lung opacities are noted concerning for multifocal pneumonia. Subcutaneous emphysema is noted in the right cervical and supraclavicular soft  tissues. Electronically Signed   By: Marijo Conception M.D.   On: 02/21/2020 16:11   DG Chest Port 1 View  Result Date: 02/20/2020 CLINICAL DATA:  Worsening hypoxia COVID positive EXAM: PORTABLE CHEST 1 VIEW COMPARISON:  02/18/2020, 02/15/2020, 01/31/2020 FINDINGS: Low lung volumes. Fairly extensive bilateral lung consolidations and ground-glass opacities, not much interval change since 02/18/2020 but progressed compared to 02/15/2020. Stable cardiomediastinal silhouette. No pneumothorax. IMPRESSION: Fairly extensive bilateral lung consolidations and ground-glass opacities, not much changed since 02/18/2020 but progressed compared to 02/15/2020. Electronically Signed   By: Donavan Foil M.D.   On: 02/20/2020 16:09   DG Chest Port 1 View  Result Date: 02/18/2020 CLINICAL DATA:  Vomiting and abdominal pain. EXAM: PORTABLE CHEST 1 VIEW COMPARISON:  02/15/2020 FINDINGS: The cardiac silhouette, mediastinal and hilar contours are stable. Persistent diffuse but patchy and asymmetric pulmonary infiltrates. No pleural effusions. No pneumothorax. Air distended bowel noted in the upper abdomen. IMPRESSION: Persistent diffuse but patchy and asymmetric pulmonary infiltrates. Electronically Signed   By: Marijo Sanes M.D.   On: 02/18/2020 08:23   DG Chest Port 1 View  Result Date: 02/15/2020 CLINICAL DATA:  Hypoxia.  History of hypertension. EXAM: PORTABLE CHEST 1 VIEW COMPARISON:  01/25/2020 FINDINGS: Patchy areas of airspace opacity are noted, similar on the left, more conspicuous on the right, than the previous exam. These are mostly in the mid and lower lungs. Findings are accentuated by low lung volumes. No convincing pleural effusion and no pneumothorax. Cardiac silhouette is normal  in size. No mediastinal or hilar masses. Skeletal structures are grossly intact. IMPRESSION: 1. Patchy bilateral airspace lung opacities suspicious for multifocal pneumonia. Electronically Signed   By: Lajean Manes M.D.   On:  02/15/2020 17:41   [place] EKG SITE RITE  Result Date: 02/21/2020 If Site Rite image not attached, placement could not be confirmed due to current cardiac rhythm.   Microbiology Recent Results (from the past 240 hour(s))  Culture, blood (Routine X 2) w Reflex to ID Panel     Status: None (Preliminary result)   Collection Time: 02/21/20  2:28 PM   Specimen: BLOOD  Result Value Ref Range Status   Specimen Description BLOOD RIGHT HAND  Final   Special Requests   Final    BOTTLES DRAWN AEROBIC AND ANAEROBIC Blood Culture adequate volume   Culture   Final    NO GROWTH < 24 HOURS Performed at [facility], [address], [place], [place] [address]    Report Status PENDING  Incomplete  MRSA PCR Screening     Status: None   Collection Time: 02/21/20  2:29 PM   Specimen: Nasopharyngeal  Result Value Ref Range Status   MRSA by PCR NEGATIVE NEGATIVE Final    Comment:        The GeneXpert MRSA Assay (FDA approved for NASAL specimens only), is one component of a comprehensive MRSA colonization surveillance program. It is not intended to diagnose MRSA infection nor to guide or monitor treatment for MRSA infections. Performed at [facility], [address] [place], [place] [address]   Culture, blood (Routine X 2) w Reflex to ID Panel     Status: None (Preliminary result)   Collection Time: 02/21/20  2:30 PM   Specimen: BLOOD  Result Value Ref Range Status   Specimen Description BLOOD RAC  Final   Special Requests   Final    BOTTLES DRAWN AEROBIC AND ANAEROBIC Blood Culture adequate volume   Culture   Final    NO GROWTH < 24 HOURS Performed at [facility], [address] [place], [place] [address]    Report Status PENDING  Incomplete    Lab Basic Metabolic Panel: Recent Labs  Lab 02/18/20 0436 02/18/20 0600 02/19/20 0430 02/20/20 0730 02/21/20 0519 02/21/20 2045 2020-03-01 0100  NA 136  --  137 135 136  --  140  K 4.5  --  4.7 4.9 5.1 3.7  5.2*  CL 99  --  103 99 99  --  101  CO2 27  --  24 23 22   --  22  GLUCOSE 91  --  77 176* 249*  --  119*  BUN 49*  --  50* 51* 76*  --  88*  CREATININE 1.30*  --  1.28* 1.38* 2.25*  --  3.60*  CALCIUM 8.4*  --  8.0* 8.3* 8.2*  --  6.5*  MG  --  2.7* 2.5* 2.6* 2.7*  --   --    Liver Function Tests: Recent Labs  Lab 03/01/2020 0100  AST 807*  ALT 506*  ALKPHOS 85  BILITOT 0.8  PROT 4.0*  ALBUMIN 1.3*   No results for input(s): LIPASE, AMYLASE in the last 168 hours. No results for input(s): AMMONIA in the last 168 hours. CBC: Recent Labs  Lab 02/18/20 0436 02/19/20 0430 02/20/20 0730 02/21/20 0519 03-01-2020 0100  WBC 26.6* 21.0* 18.4* 17.3* 2.4*  NEUTROABS 25.2*  --   --   --   --  HGB 14.5 13.3 12.7* 12.0* 9.2*  HCT 44.5 40.4 38.8* 36.2* 28.3*  MCV 96.3 96.2 98.0 96.0 99.3  PLT 407* 288 199 206 131*   Cardiac Enzymes: No results for input(s): CKTOTAL, CKMB, CKMBINDEX, TROPONINI in the last 168 hours. Sepsis Labs: Recent Labs  Lab 02/18/20 0600 02/18/20 0714 02/19/20 0430 02/19/20 1124 02/20/20 0730 02/20/20 1126 02/21/20 0519 02/21/20 1429 02/21/20 1735 02/21/20 2045 03-14-2020 0100  PROCALCITON 0.20  --   --   --   --   --  3.64  --   --   --   --   WBC  --   --  21.0*  --  18.4*  --  17.3*  --   --   --  2.4*  LATICACIDVEN  --    < >  --    < >  --  2.6*  --  >11.0* 7.9* >11.0*  --    < > = values in this interval not displayed.    Procedures/Operations  02/21/20 ETT 02/21/20 PICC   Chasitee Zenker L Rust-Chester 03/14/2020, 7:08 AM  Domingo Pulse Rust-Chester, AGACNP-BC Acute Care Nurse Practitioner [facility]   [phone] / [phone] Please see Amion for pager details.

## 2020-03-13 NOTE — Progress Notes (Signed)
ANTICOAGULATION CONSULT NOTE - Initial Consult  Pharmacy Consult for heparin infusion Indication: chest pain/ACS/STEMI  Allergies  Allergen Reactions  . Penicillins Hives    Patient Measurements: Height: 5' 9.5" (176.5 cm) Weight: 81.7 kg (180 lb 3.2 oz) IBW/kg (Calculated) : 71.85   Vital Signs: Temp: 97.34 F (36.3 C) (12/11 1930) Temp Source: Axillary (12/11 1400) BP: 77/51 (12/11 1930) Pulse Rate: 54 (12/11 1930)  Labs: Recent Labs    02/19/20 0430 02/20/20 0730 02/21/20 0519 02/21/20 2214  HGB 13.3 12.7* 12.0*  --   HCT 40.4 38.8* 36.2*  --   PLT 288 199 206  --   CREATININE 1.28* 1.38* 2.25*  --   TROPONINIHS  --   --   --  517*    Estimated Creatinine Clearance: 35.1 mL/min (A) (by C-G formula based on SCr of 2.25 mg/dL (H)).   Medical History: Past Medical History:  Diagnosis Date  . Depression   . Hx of colonic polyps   . Hypertension     Medications:  Apixaban 5mg  - Last dose 12/11 @ 1000  Goal of Therapy:  Heparin level 0.3-0.7 units/ml aPTT 66 - 102 seconds Monitor platelets by anticoagulation protocol: Yes   Plan:  Start heparin infusion at 1000 units/hr per TBW Due to DOAC, will check aPTT q6hr until until therapeutic and daily until  aPTT and HL levels correlate.  Will check HL daily. Continue to monitor H&H and Platelets.  Renda Rolls, PharmD, MBA Mar 21, 2020 1:12 AM

## 2020-03-13 NOTE — Progress Notes (Signed)
  Chaplain On-Call was paged by ICU Unit Secretary Thayer Headings with report of patient in grave condition and family is expected to arrive to the Unit.  Chaplain provided support for staff members while awaiting the family, who arrived at 0130 hours.  Patient's wife Alfred Clements and daughter Alfred Clements were present. Chaplain attended family discussion with NP Domingo Pulse Rush-Chester, who made the family aware of multiple rapid declines in the patient's condition. Patient's wife and daughter spoke by phone with other family members, and later advised the NP to retain Full Code status for the patient.  Chaplain provided spiritual and emotional support for Alfred Clements and Alfred Clements.  While in the ICU waiting room, patient's wife experienced chest pain and dizziness. Two ICU Nurses transported [name] to the ED for assessment. Alfred Clements is being treated in ED room 9.  Chaplain remains available for further support as needed.  Corunna Saed Hudlow M.Div., [facility]

## 2020-03-13 NOTE — Progress Notes (Addendum)
GOALS OF CARE DISCUSSION  The wife and daughter were asked to come into the hospital due to the patient's deteriorating condition. His clinical status was relayed to family in detail.  Patient with Progressive multiorgan failure: requiring 4 vasopressors on max ventilator settings.  Explained that the patient is at a high risk of cardiac arrest with very low chance of meaningful recovery despite all aggressive and optimal medical therapy. Patient is in the Dying Process associated with Suffering.  Family understands the situation. And at this time would like to continue aggressive measures and treat the patient as a FULL code.  Obtained consent for arterial line placement to continue aggressive supportive measures with more accurate BP readings.  Family are satisfied with Plan of action and management. All questions answered  Additional CC time 35 mins   Attempted but unable to place arterial line in femoral artery, family aware.  Domingo Pulse Rust-Chester, AGACNP-BC Acute Care Nurse Practitioner [facility]   [phone] / [phone] Please see Amion for pager details.

## 2020-03-13 DEATH — deceased

## 2020-03-16 LAB — CULTURE, BLOOD (ROUTINE X 2): Special Requests: ADEQUATE

## 2021-08-04 IMAGING — DX DG CHEST 1V PORT
1 series · 1 of 1 positions shown · non-contrast
Comparison: 02/18/2020, 02/15/2020, 02/04/2020

CLINICAL DATA: Worsening hypoxia COVID positive

EXAM:
PORTABLE CHEST 1 VIEW

[chest ap]
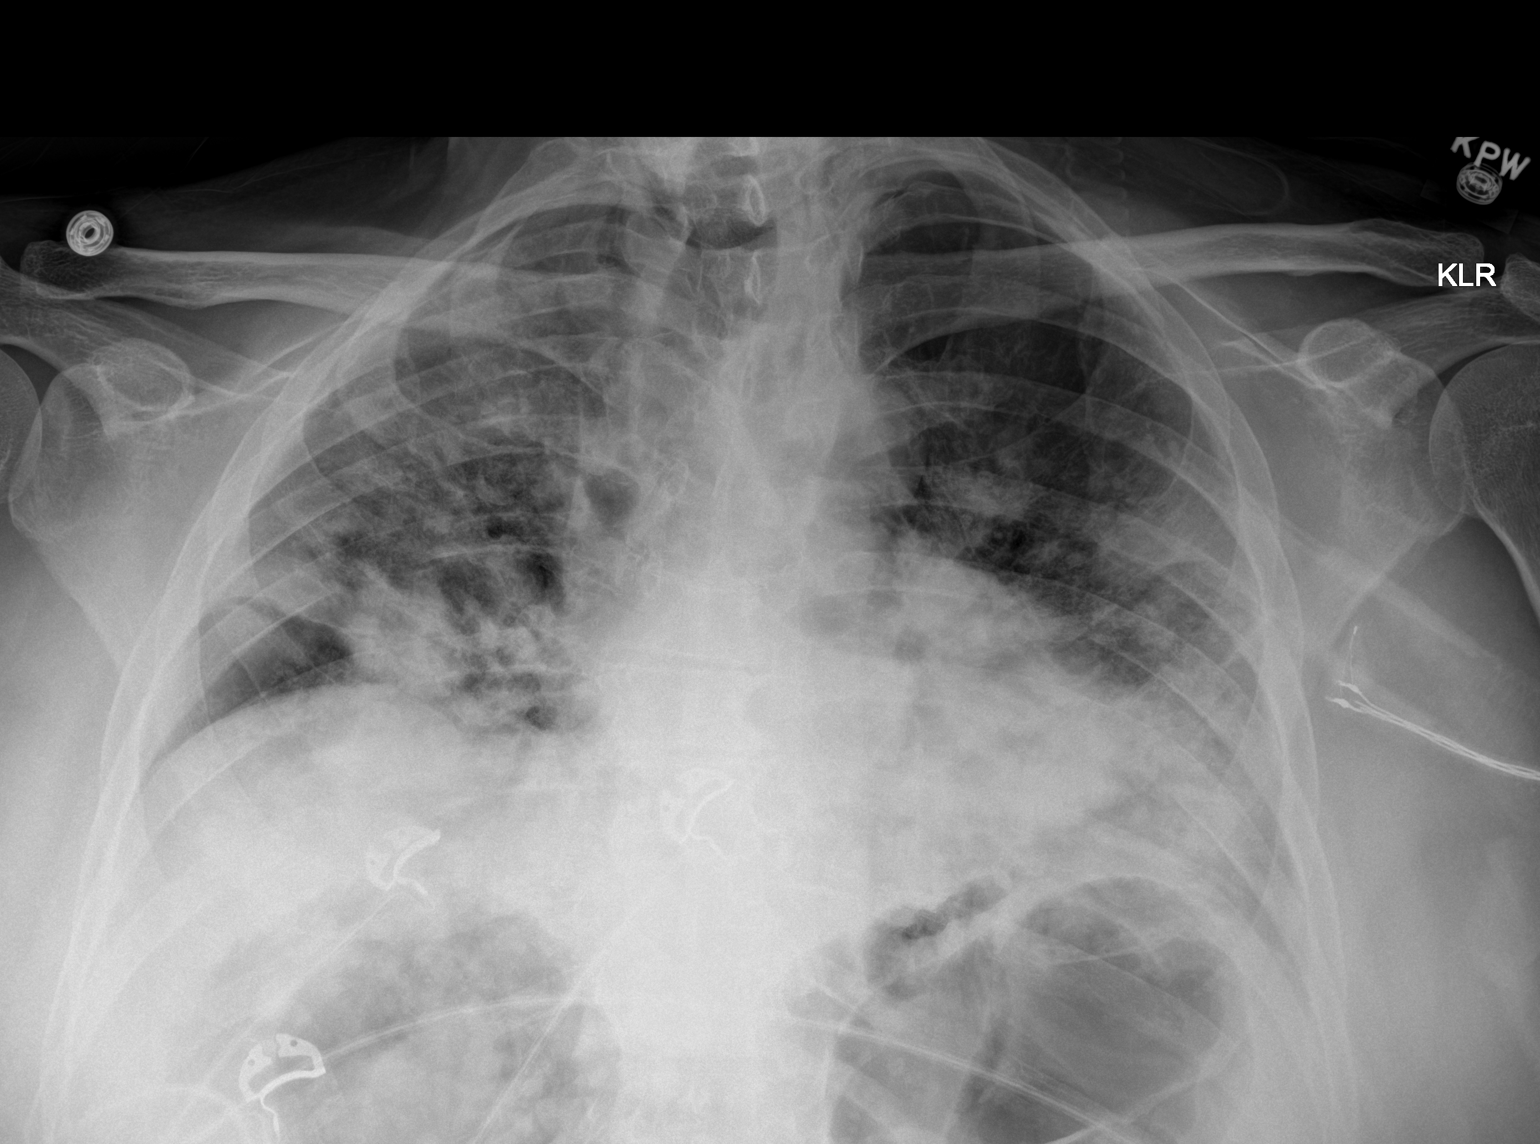

[1 of 1 positions shown; findings below may reference images not displayed]

FINDINGS: Low lung volumes. Fairly extensive bilateral lung consolidations and
ground-glass opacities, not much interval change since 02/18/2020
but progressed compared to 02/15/2020. Stable cardiomediastinal
silhouette. No pneumothorax.
IMPRESSION: Fairly extensive bilateral lung consolidations and ground-glass
opacities, not much changed since 02/18/2020 but progressed compared
to 02/15/2020.

## 2021-08-05 IMAGING — DX DG CHEST 1V PORT
1 series · 1 of 1 positions shown · non-contrast
Comparison: February 20, 2020.

CLINICAL DATA: Status post intubation.

EXAM:
PORTABLE CHEST 1 VIEW

[chest ap]
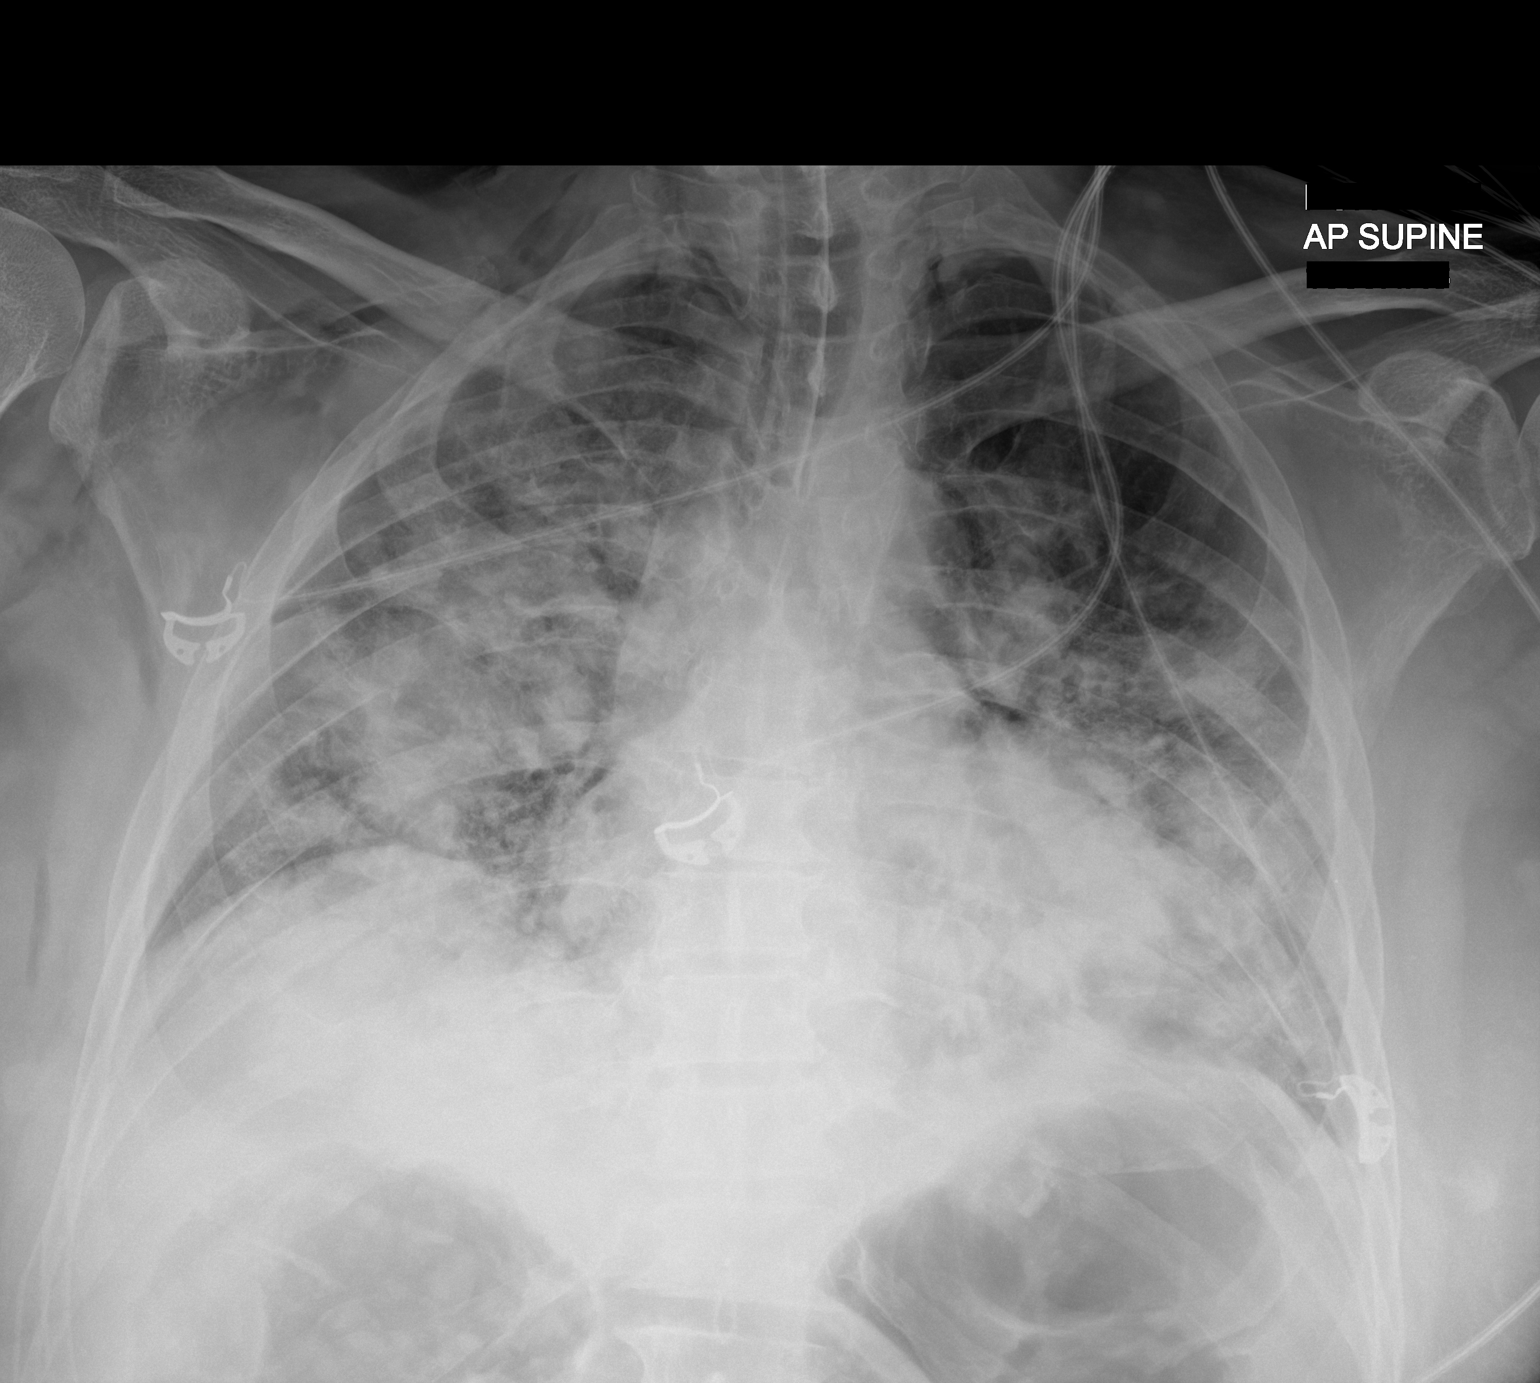

[1 of 1 positions shown; findings below may reference images not displayed]

FINDINGS: Stable cardiomediastinal silhouette. Endotracheal tube is in grossly
good position. No pneumothorax is noted. Stable bilateral lung
opacities are noted concerning for multifocal pneumonia. No pleural
effusion is noted. Subcutaneous emphysema is noted in the right
cervical and supraclavicular soft tissues. Bony thorax is
unremarkable.
IMPRESSION: Endotracheal tube in grossly good position. Stable bilateral lung
opacities are noted concerning for multifocal pneumonia.
Subcutaneous emphysema is noted in the right cervical and
supraclavicular soft tissues.

## 2021-08-06 IMAGING — DX DG CHEST 1V PORT
2 series · 2 of 2 positions shown · non-contrast
Comparison: 02/21/2020

CLINICAL DATA: NG tube placement

EXAM:
PORTABLE CHEST 1 VIEW

[chest ap (1 of 2)]
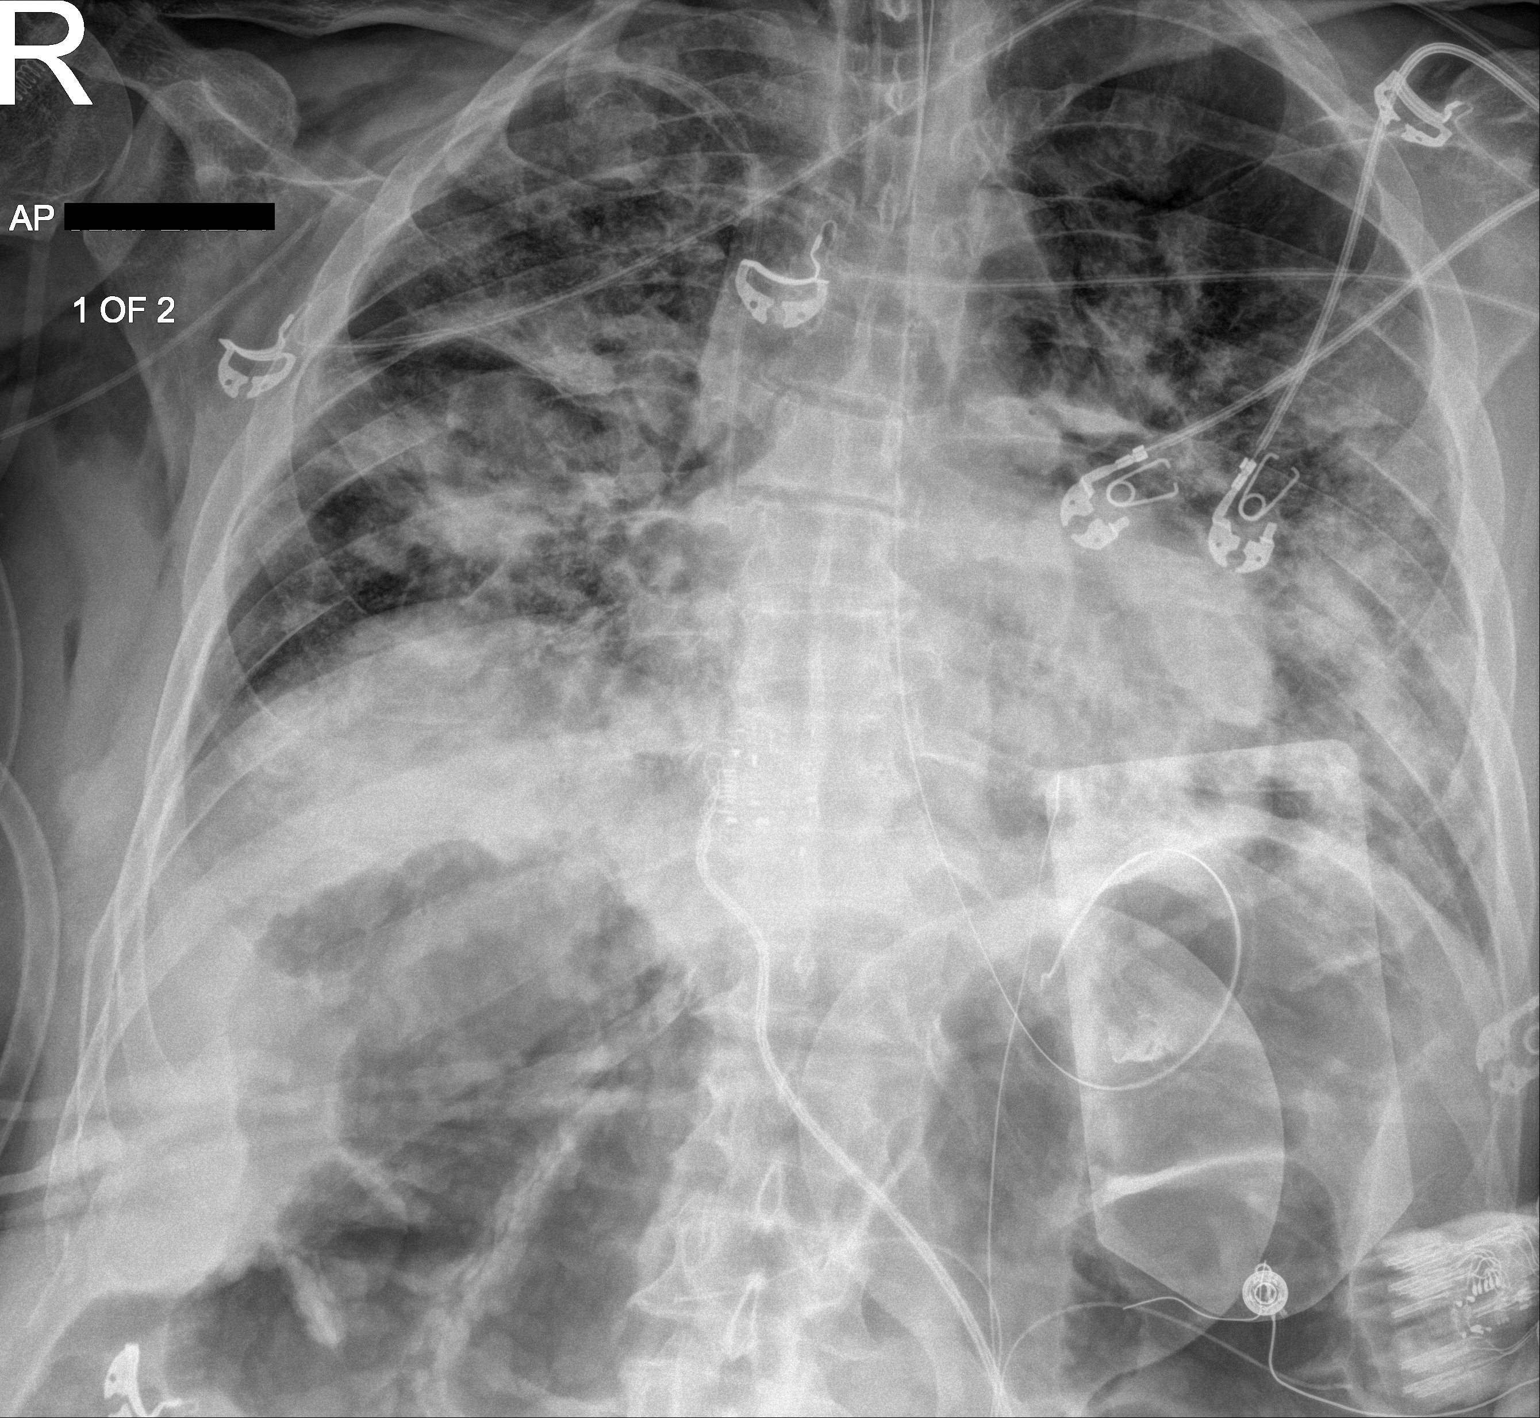

[chest ap (2 of 2)]
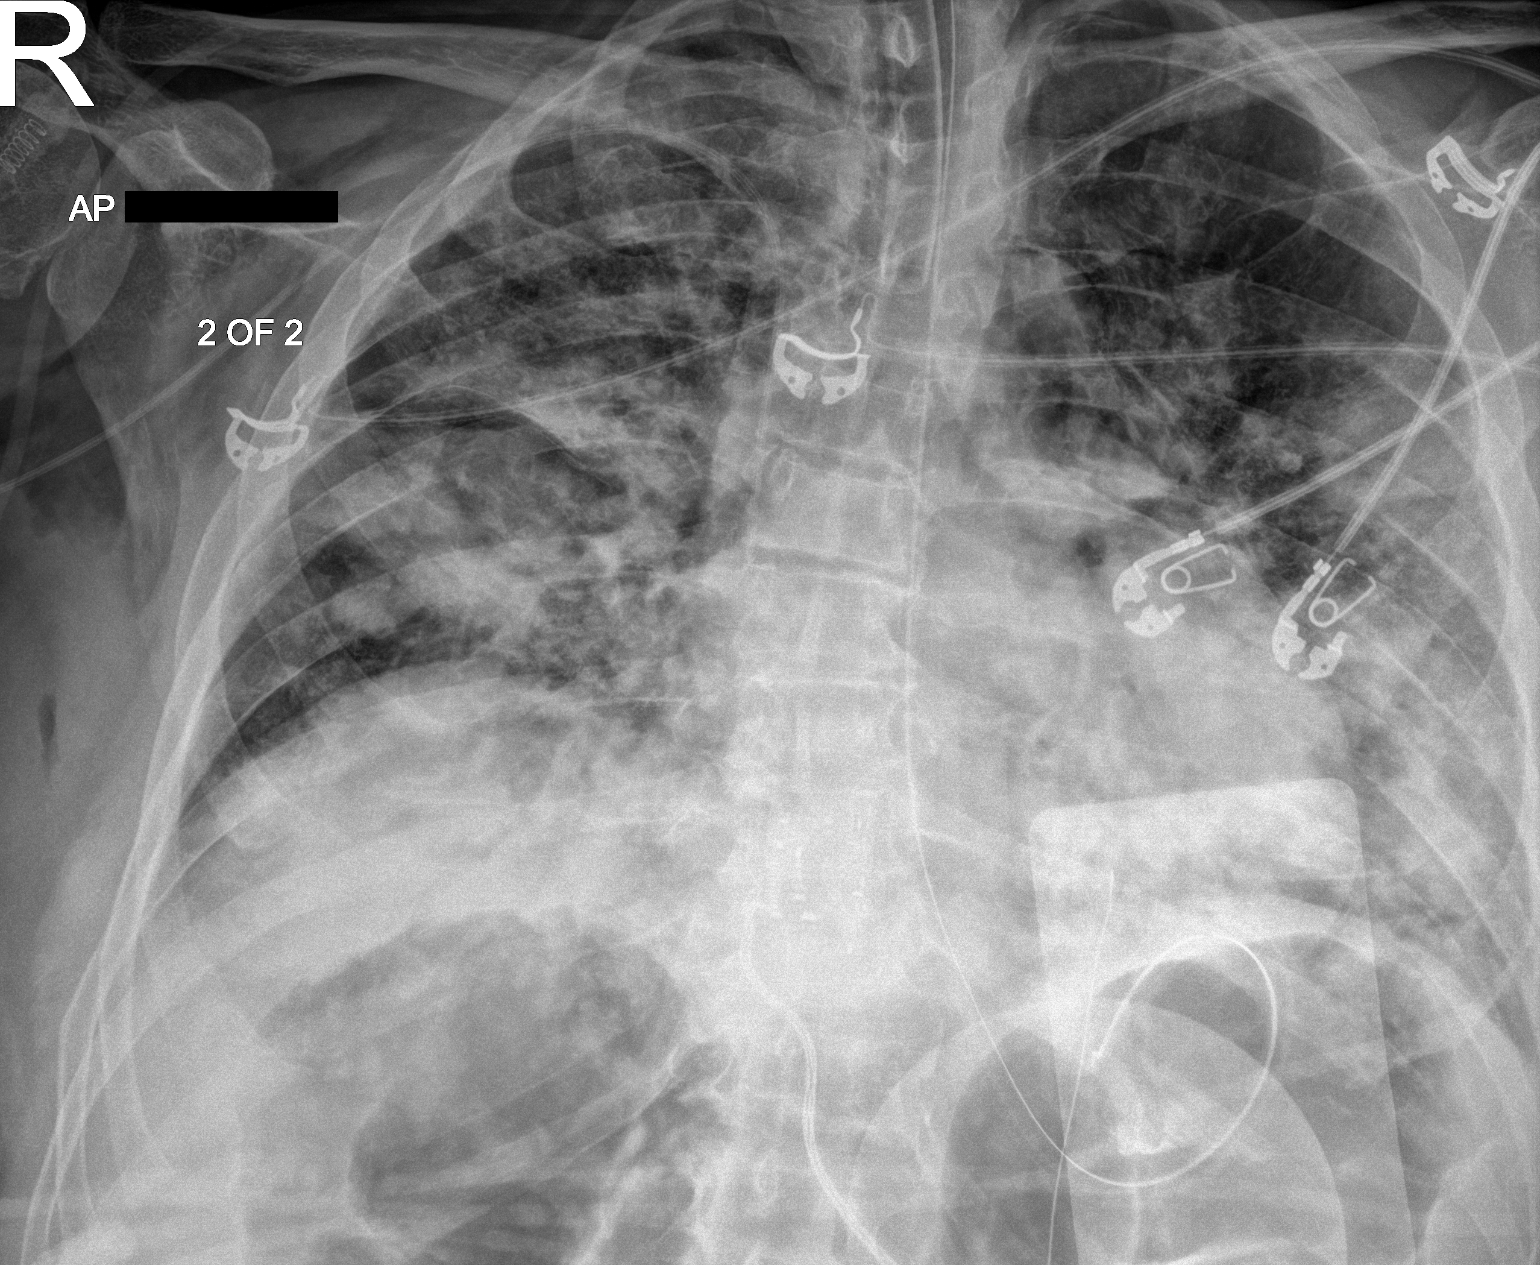

[2 of 2 positions shown; findings below may reference images not displayed]

FINDINGS: Endotracheal tube is in stable position. NG tube coils in the
stomach. Extensive bilateral airspace disease, unchanged since prior
study. Heart is normal size. No visible effusions. Diffuse gaseous
distention of colon seen within the upper abdomen.
IMPRESSION: NG tube coils in the stomach.

Stable diffuse bilateral airspace disease.

Stable gaseous distention of bowel in the upper abdomen which
appears to be colon.

## 2021-08-06 IMAGING — DX DG ABDOMEN 1V
1 series · 1 of 1 positions shown · non-contrast
Comparison: 02/21/2020

CLINICAL DATA: NG tube placement

EXAM:
ABDOMEN - 1 VIEW

[abdomen supine]
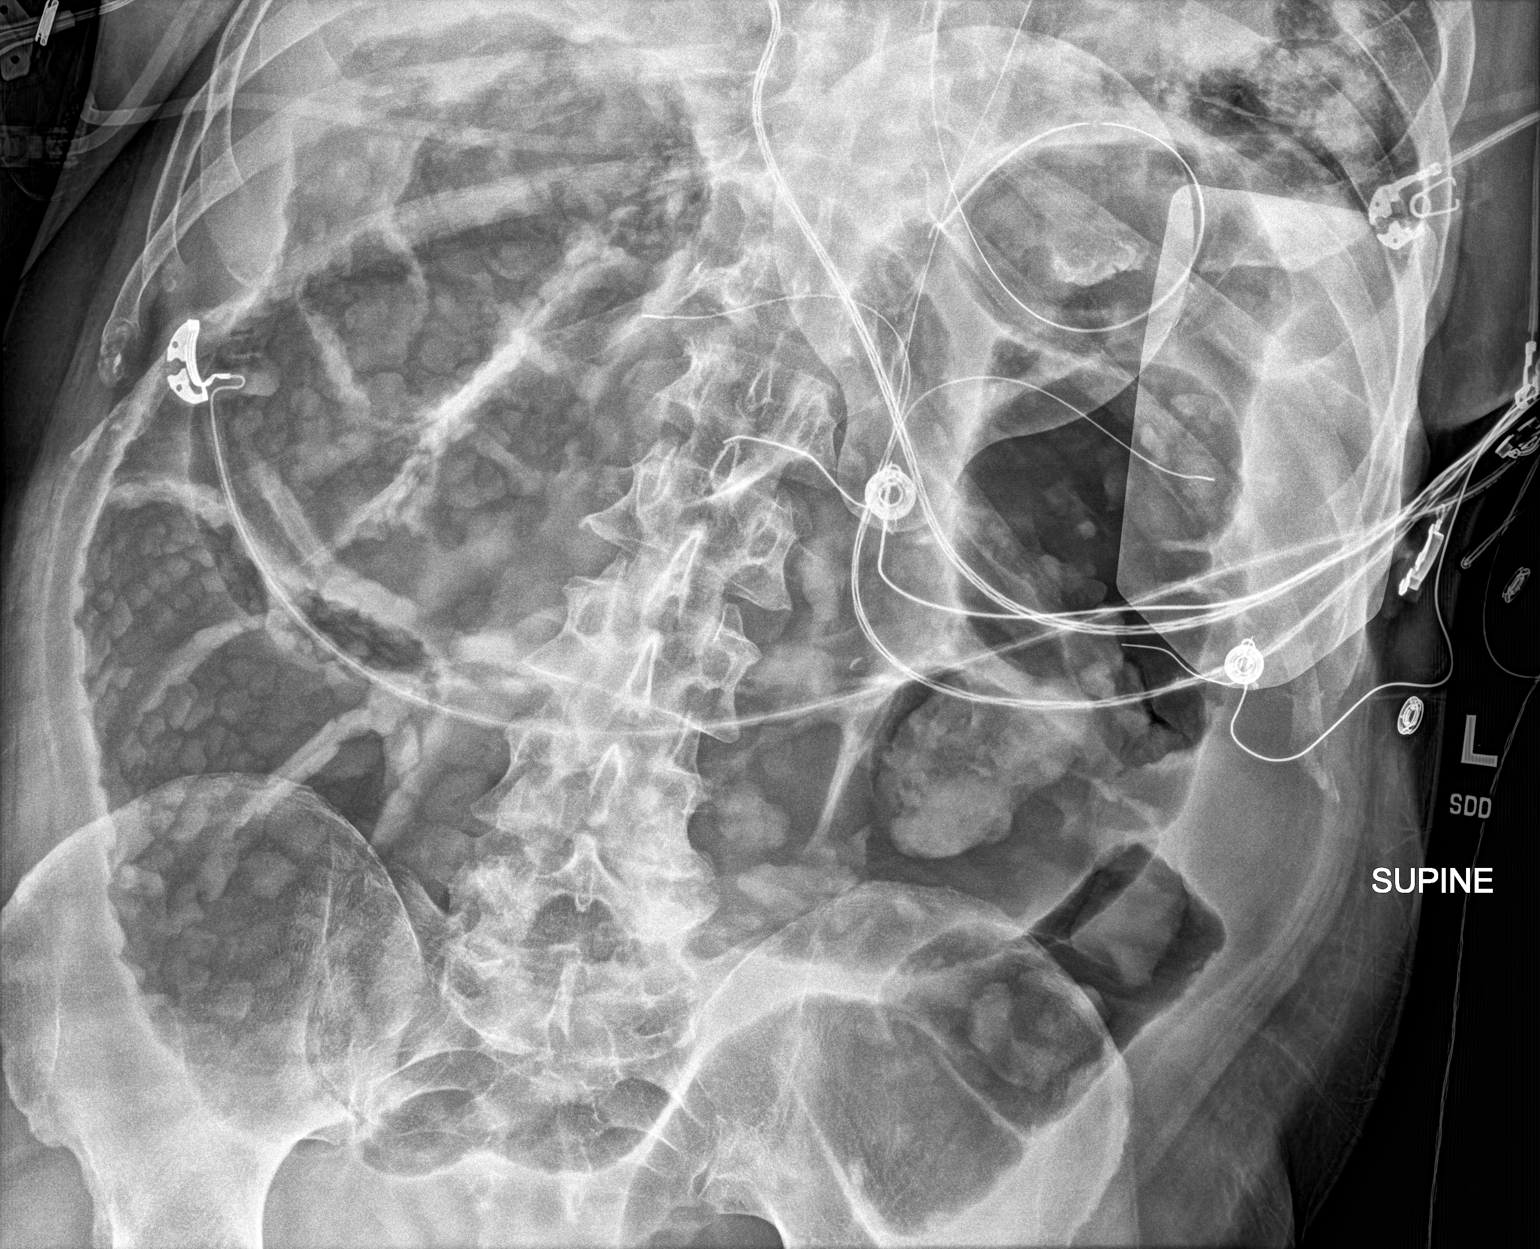

[1 of 1 positions shown; findings below may reference images not displayed]

FINDINGS: NG tube coils in the fundus of the stomach. Diffuse gaseous
distention of the colon, unchanged.
IMPRESSION: NG tube coils in the fundus of the stomach.

Stable diffuse gaseous distention of the colon.
